# Patient Record
Sex: Female | Born: 1975 | Race: Black or African American | Hispanic: No | Marital: Single | Smoking: Former smoker
Health system: Southern US, Community
[De-identification: ages and names within clinical notes are randomized; demographics above are authoritative.]

---

## 2002-02-11 ENCOUNTER — Encounter: Payer: Self-pay | Admitting: Family Medicine

## 2002-02-11 ENCOUNTER — Ambulatory Visit (HOSPITAL_COMMUNITY): Admission: RE | Admit: 2002-02-11 | Discharge: 2002-02-11 | Payer: Self-pay | Admitting: Family Medicine

## 2004-09-28 ENCOUNTER — Emergency Department (HOSPITAL_COMMUNITY): Admission: AC | Admit: 2004-09-28 | Discharge: 2004-09-28 | Payer: Self-pay

## 2009-04-03 ENCOUNTER — Inpatient Hospital Stay (HOSPITAL_COMMUNITY): Admission: EM | Admit: 2009-04-03 | Discharge: 2009-04-04 | Payer: Self-pay | Admitting: Emergency Medicine

## 2010-05-13 IMAGING — CT CT ANGIO CHEST
2 of 6 series · 6 of 36 positions shown · IV contrast (Omnipaque 300)
Comparison: Chest radiograph from same day.

CLINICAL DATA: 33-year-old female with chest pain, shortness of
breath, right arm pain.

CT ANGIOGRAPHY CHEST WITH CONTRAST
TECHNIQUE: Multidetector CT imaging of the chest was performed
using the standard protocol during bolus administration of
intravenous contrast. Multiplanar CT image reconstructions
including MIPs were obtained to evaluate the vascular anatomy.
Contrast: 100 ml [id number].

[Series 5: pe 3.0 b40f · axial · 0.65mm/px · z∈[+772,+964]mm · 5 of 96 slices shown]
[im 16/96  lung]
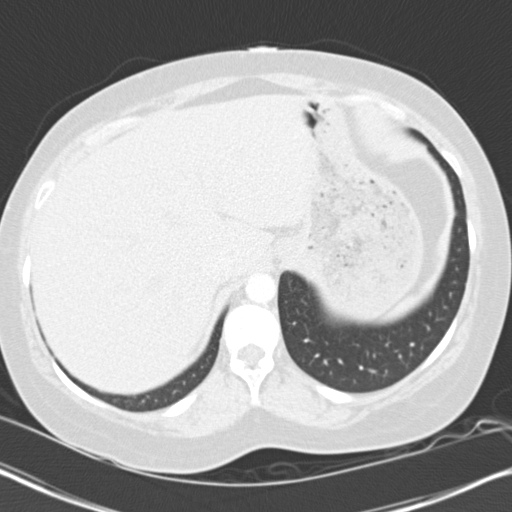
[im 32/96  mediastinal]
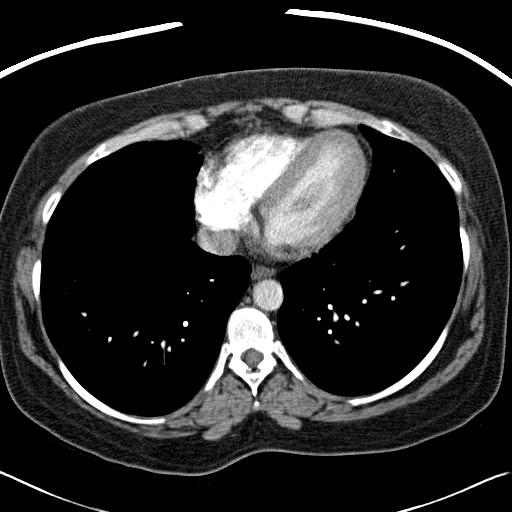
[im 48/96  lung]
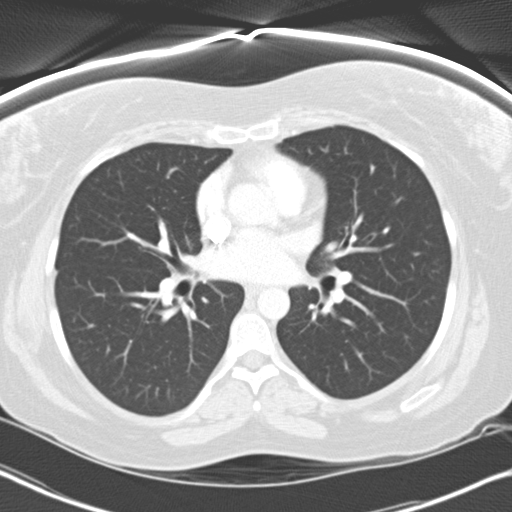
[im 64/96  mediastinal]
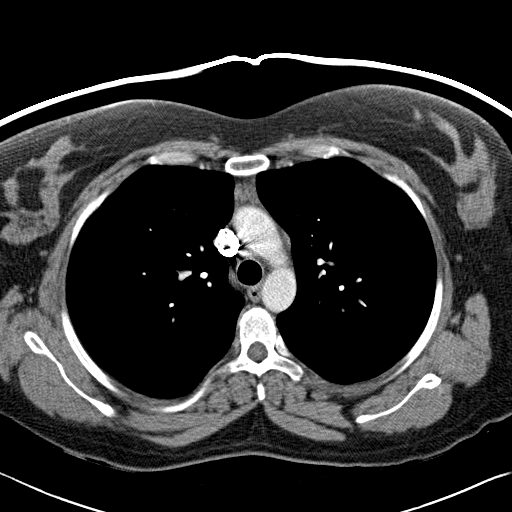
[im 80/96  lung]
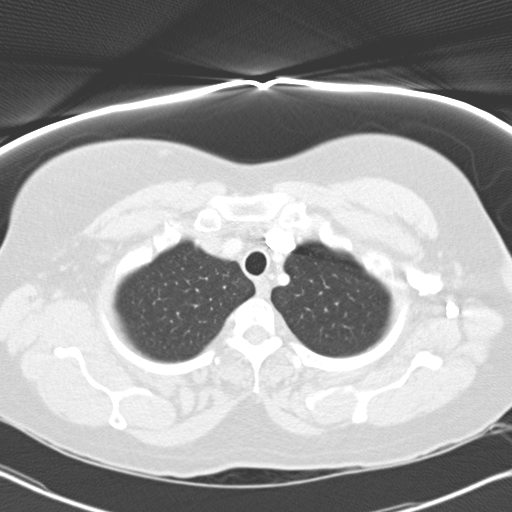

[Series 7: mpr coronal pe 3mm · coronal · 0.61mm/px · 1 of 101 slices shown]
[im 51/101  mediastinal]
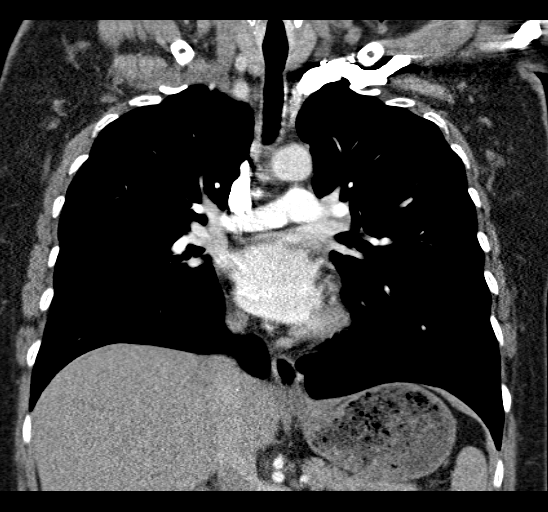

[6 of 36 positions shown; findings below may reference images not displayed]

FINDINGS: Good contrast bolus timing in the pulmonary arterial
tree.  No focal filling defect identified in the pulmonary arterial
tree to suggest the presence of acute pulmonary embolism.

Major airways are patent.  The lungs are clear.

No pericardial or pleural effusion.  No lymphadenopathy.
Visualized aorta is normal.  Visualized thoracic inlet and upper
abdominal viscera are within normal limits.

No acute osseous abnormality identified.

Review of the MIP images confirms the above findings.
IMPRESSION: 1. No evidence of acute pulmonary embolus.
2.  No acute findings in the chest.

## 2010-05-13 IMAGING — CR DG FOREARM 2V*R*
1 series · 1 of 1 positions shown · non-contrast
Comparison: Right humerus series from the same day.

CLINICAL DATA: 33-year-old female with right arm pain from shoulder
to the wrist.  No known injury.

RIGHT FOREARM - 2 VIEW

[view not recorded]
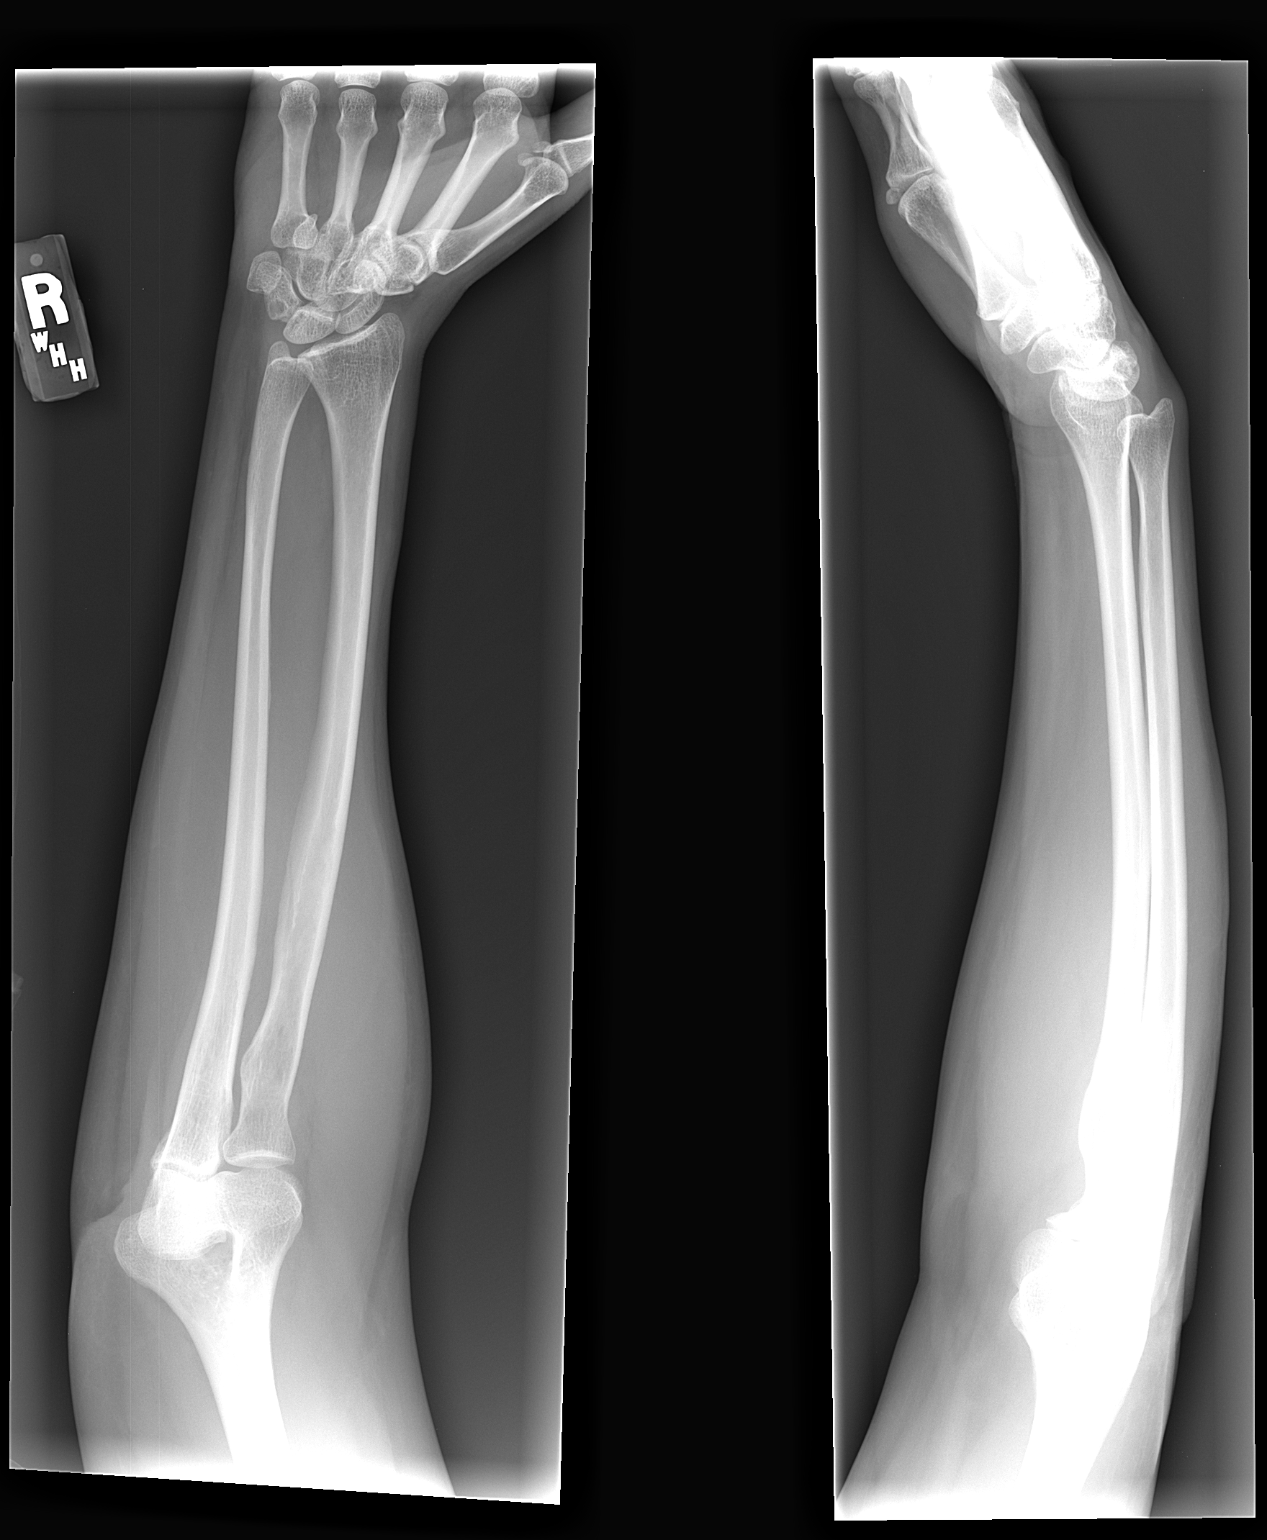

[1 of 1 positions shown; findings below may reference images not displayed]

FINDINGS: Bone mineralization is within normal limits.  Ulna minus
variance.  No fracture or dislocation identified involving the
right radius or ulna.  Grossly normal alignment at the right elbow
and right wrist.  Grossly normal right carpal bone alignment.
IMPRESSION: No acute fracture or dislocation identified about the right radius
or ulna.

## 2010-12-07 ENCOUNTER — Emergency Department (HOSPITAL_COMMUNITY): Admit: 2010-12-07 | Discharge: 2010-12-07 | Disposition: A | Discharge status: Home / Self Care | Payer: Self-pay

## 2010-12-07 ENCOUNTER — Emergency Department (HOSPITAL_COMMUNITY)
Admission: EM | Admit: 2010-12-07 | Discharge: 2010-12-07 | Disposition: A | Discharge status: Home / Self Care | Payer: Self-pay | Attending: Emergency Medicine | Admitting: Emergency Medicine

## 2010-12-07 DIAGNOSIS — IMO0002 Reserved for concepts with insufficient information to code with codable children: Secondary | ICD-10-CM | POA: Insufficient documentation

## 2010-12-07 DIAGNOSIS — T59893A Toxic effect of other specified gases, fumes and vapors, assault, initial encounter: Secondary | ICD-10-CM | POA: Insufficient documentation

## 2010-12-07 DIAGNOSIS — T593X4A Toxic effect of lacrimogenic gas, undetermined, initial encounter: Secondary | ICD-10-CM | POA: Insufficient documentation

## 2010-12-07 DIAGNOSIS — H10219 Acute toxic conjunctivitis, unspecified eye: Secondary | ICD-10-CM | POA: Insufficient documentation

## 2010-12-07 DIAGNOSIS — T5993XA Toxic effect of unspecified gases, fumes and vapors, assault, initial encounter: Secondary | ICD-10-CM | POA: Insufficient documentation

## 2011-02-11 LAB — CBC
HCT: 42.3 % (ref 36.0–46.0)
Hemoglobin: 15 g/dL (ref 12.0–15.0)
MCHC: 35.4 g/dL (ref 30.0–36.0)
MCV: 95.2 fL (ref 78.0–100.0)
Platelets: 202 10*3/uL (ref 150–400)
RBC: 4.45 MIL/uL (ref 3.87–5.11)
RDW: 12.6 % (ref 11.5–15.5)
WBC: 11.1 10*3/uL — ABNORMAL HIGH (ref 4.0–10.5)

## 2011-02-11 LAB — COMPREHENSIVE METABOLIC PANEL
ALT: 91 U/L — ABNORMAL HIGH (ref 0–35)
AST: 148 U/L — ABNORMAL HIGH (ref 0–37)
Albumin: 3.6 g/dL (ref 3.5–5.2)
Alkaline Phosphatase: 74 U/L (ref 39–117)
BUN: 4 mg/dL — ABNORMAL LOW (ref 6–23)
CO2: 22 mEq/L (ref 19–32)
Calcium: 8.6 mg/dL (ref 8.4–10.5)
Chloride: 105 mEq/L (ref 96–112)
Creatinine, Ser: 0.62 mg/dL (ref 0.4–1.2)
GFR calc Af Amer: >60 mL/min (ref >60)
GFR calc non Af Amer: >60 mL/min (ref >60)
Glucose, Bld: 90 mg/dL (ref 70–99)
Potassium: 3.8 mEq/L (ref 3.5–5.1)
Sodium: 134 mEq/L — ABNORMAL LOW (ref 135–145)
Total Bilirubin: 0.7 mg/dL (ref 0.3–1.2)
Total Protein: 6.3 g/dL (ref 6.0–8.3)

## 2011-02-11 LAB — DIFFERENTIAL
Basophils Absolute: 0 10*3/uL (ref 0.0–0.1)
Basophils Relative: 0 % (ref 0–1)
Eosinophils Absolute: 0.3 10*3/uL (ref 0.0–0.7)
Eosinophils Relative: 3 % (ref 0–5)
Lymphocytes Relative: 33 % (ref 12–46)
Lymphs Abs: 3.6 10*3/uL (ref 0.7–4.0)
Monocytes Absolute: 0.8 10*3/uL (ref 0.1–1.0)
Monocytes Relative: 7 % (ref 3–12)
Neutro Abs: 6.4 10*3/uL (ref 1.7–7.7)
Neutrophils Relative %: 58 % (ref 43–77)

## 2011-02-11 LAB — PHOSPHORUS: Phosphorus: 2.7 mg/dL (ref 2.3–4.6)

## 2011-02-11 LAB — CK: Total CK: >4100 U/L — ABNORMAL HIGH (ref 7–177)

## 2011-02-12 LAB — URINALYSIS, ROUTINE W REFLEX MICROSCOPIC
Bilirubin Urine: NEGATIVE
Glucose, UA: NEGATIVE mg/dL
Ketones, ur: NEGATIVE mg/dL
Leukocytes, UA: NEGATIVE
Nitrite: NEGATIVE
Protein, ur: NEGATIVE mg/dL
Specific Gravity, Urine: <1.005 — ABNORMAL LOW (ref 1.005–1.030)
Urobilinogen, UA: 0.2 mg/dL (ref 0.0–1.0)
pH: 5.5 (ref 5.0–8.0)

## 2011-02-12 LAB — DIFFERENTIAL
Basophils Absolute: 0.1 10*3/uL (ref 0.0–0.1)
Basophils Relative: 0 % (ref 0–1)
Eosinophils Absolute: 0.2 10*3/uL (ref 0.0–0.7)
Eosinophils Relative: 1 % (ref 0–5)
Lymphocytes Relative: 17 % (ref 12–46)
Lymphs Abs: 2.7 10*3/uL (ref 0.7–4.0)
Monocytes Absolute: 0.6 10*3/uL (ref 0.1–1.0)
Monocytes Relative: 4 % (ref 3–12)
Neutro Abs: 12.6 10*3/uL — ABNORMAL HIGH (ref 1.7–7.7)
Neutrophils Relative %: 78 % — ABNORMAL HIGH (ref 43–77)

## 2011-02-12 LAB — CBC
HCT: 42.3 % (ref 36.0–46.0)
Hemoglobin: 15.1 g/dL — ABNORMAL HIGH (ref 12.0–15.0)
MCHC: 35.7 g/dL (ref 30.0–36.0)
MCV: 95 fL (ref 78.0–100.0)
Platelets: 199 10*3/uL (ref 150–400)
RBC: 4.46 MIL/uL (ref 3.87–5.11)
RDW: 13.1 % (ref 11.5–15.5)
WBC: 16.1 10*3/uL — ABNORMAL HIGH (ref 4.0–10.5)

## 2011-02-12 LAB — RAPID URINE DRUG SCREEN, HOSP PERFORMED
Barbiturates: NOT DETECTED
Cocaine: POSITIVE — AB
Opiates: NOT DETECTED

## 2011-02-12 LAB — BASIC METABOLIC PANEL
BUN: 17 mg/dL (ref 6–23)
CO2: 24 mEq/L (ref 19–32)
Calcium: 8.9 mg/dL (ref 8.4–10.5)
Chloride: 104 mEq/L (ref 96–112)
Creatinine, Ser: 0.96 mg/dL (ref 0.4–1.2)
GFR calc Af Amer: >60 mL/min (ref >60)
GFR calc non Af Amer: >60 mL/min (ref >60)
Glucose, Bld: 163 mg/dL — ABNORMAL HIGH (ref 70–99)
Potassium: 3.8 mEq/L (ref 3.5–5.1)
Sodium: 134 mEq/L — ABNORMAL LOW (ref 135–145)

## 2011-02-12 LAB — POCT CARDIAC MARKERS
CKMB, poc: 50.5 ng/mL (ref 1.0–8.0)
CKMB, poc: 67.2 ng/mL (ref 1.0–8.0)
Myoglobin, poc: >500 ng/mL (ref 12–200)
Myoglobin, poc: >500 ng/mL (ref 12–200)
Troponin i, poc: <0.05 ng/mL (ref 0.00–0.09)
Troponin i, poc: <0.05 ng/mL (ref 0.00–0.09)

## 2011-02-12 LAB — D-DIMER, QUANTITATIVE: D-Dimer, Quant: 2.52 ug/mL-FEU — ABNORMAL HIGH (ref 0.00–0.48)

## 2011-02-12 LAB — URINE MICROSCOPIC-ADD ON

## 2011-02-12 LAB — HEPATIC FUNCTION PANEL
ALT: 89 U/L — ABNORMAL HIGH (ref 0–35)
Alkaline Phosphatase: 71 U/L (ref 39–117)
Bilirubin, Direct: 0.1 mg/dL (ref 0.0–0.3)
Indirect Bilirubin: 0.3 mg/dL (ref 0.3–0.9)

## 2011-02-12 LAB — CK TOTAL AND CKMB (NOT AT ARMC)
CK, MB: 93.3 ng/mL — ABNORMAL HIGH (ref 0.3–4.0)
Relative Index: 0.3 (ref 0.0–2.5)
Total CK: 28899 U/L — ABNORMAL HIGH (ref 7–177)

## 2011-02-12 LAB — PROTIME-INR: INR: 0.9 (ref 0.00–1.49)

## 2011-02-12 LAB — PREGNANCY, URINE: Preg Test, Ur: NEGATIVE

## 2011-02-12 LAB — CARDIAC PANEL(CRET KIN+CKTOT+MB+TROPI)
Relative Index: 1.1 (ref 0.0–2.5)
Total CK: 23309 U/L — ABNORMAL HIGH (ref 7–177)
Troponin I: <0.01 ng/mL (ref 0.00–0.06)

## 2011-02-12 LAB — PHOSPHORUS: Phosphorus: 3 mg/dL (ref 2.3–4.6)

## 2011-02-12 LAB — TROPONIN I: Troponin I: 0.02 ng/mL (ref 0.00–0.06)

## 2011-03-19 NOTE — H&P (Signed)
NAMEELIZAH, Angela Wise            ACCOUNT NO.:  [id number]   MEDICAL RECORD NO.:  [id number]          PATIENT TYPE:  EMS   LOCATION:  ED                            FACILITY:  [facility]   PHYSICIAN:  Osvaldo Shipper, MD     DATE OF BIRTH:  10-08-1976   DATE OF ADMISSION:  04/02/2009  DATE OF DISCHARGE:  LH                              HISTORY & PHYSICAL   PRIVATE MEDICAL DOCTOR:  None.   ADMITTING DIAGNOSES:  1. Rhabdomyolysis, unclear etiology.  2. Right arm pain, likely related to #1.  3. Polysubstance abuse.   CHIEF COMPLAINT:  Right arm pain and right chest pain.   HISTORY OF PRESENT ILLNESS:  The patient is a 35 year old Caucasian  female who was in her usual state of health until about 9 p.m. yesterday  night when she started feeling pain in her right chest as well as her  right upper arm.  She denies any injuries to that region. Denies any  shortness of breath.  The chest pain has resolved but the right arm pain  persists.  She is unable to lift her arm up by herself and she requires  some assistance with her left arm.  The pain was 10/10 in intensity when  she came in and currently is about 5/10 in intensity.  It was an achy,  throbbing type of pain with no radiation, no precipitating factor  identified.  The pain is exacerbated by movement and was not relieved  with Advil, mostly located in the right upper arm.  She also has some  tingling and numbness in the right arm.  She has had similar complaints,  about 3 times, in the past, all of them about one month ago.   MEDICATIONS:  None except for the Advil that she took to try and relieve  the pain.   ALLERGIES:  No known drug allergies.   PAST MEDICAL HISTORY:  Just had a cesarean section many years ago,  otherwise denies any medical problems.   SOCIAL HISTORY:  Lives with her mother in [place].  Currently  unemployed.  Smokes 1/2 pack of cigarettes on a daily basis.  Occasional  alcohol use, not on a daily basis.  She  admits to using marijuana.  She  did do cocaine one month ago.  She denies any recent use of cocaine.   FAMILY HISTORY:  She is adopted so she does not know her family medical  history.   REVIEW OF SYSTEMS:  GENERAL:  Positive for weakness.  HEENT:  Unremarkable.  CARDIOVASCULAR:  Unremarkable.  RESPIRATORY:  Unremarkable.  GI: Unremarkable.  GU:  Unremarkable.  MUSCULOSKELETAL:  As in HPI.  NEUROLOGICAL: Unremarkable.  PSYCHIATRIC:  Unremarkable.  DERMATOLOGIC:  Unremarkable. Other systems unremarkable.   PHYSICAL EXAMINATION:  VITAL SIGNS:  Temperature 97.4, blood pressure  134/64, heart rate 81, respiratory rate 16, oxygen saturation 100% in  room air.  GENERAL:  This is a slightly overweight white female in no distress.  HEENT:  There is no pallor, no icterus.  Oral mucosal membranes are  moist, no oral lesions are noted.  NECK: Supple,  no thyromegaly is appreciated.  No cervical  lymphadenopathy is noted.  LUNGS:  Clear to auscultation bilaterally. No wheezing, rales or  rhonchi.  Palpation of the right chest wall does not elicit any  tenderness.  CARDIOVASCULAR:  S1 and S2 normal and regular.  No murmurs appreciated.  No S3, S4, rubs or bruits.  ABDOMEN:  Soft, nontender, nondistended.  Bowel sounds present.  No mass  or organomegaly is appreciated.  GU:  Deferred.  NEUROLOGIC:  She is alert and oriented x3, no focal neurological  deficits are present apart from the right upper extremity.  The  strength, especially in the shoulder flexors and abductors, are found to  be about 3-4/5.  Peripheral pulses are palpable.  No pedal edema is present.  Right upper  arm does not show any swelling, no muscle tenderness is noted on  palpation.  No skin rash is noted.  When I try to abduct her right  shoulder and flex it, she experiences pain.   LABORATORY DATA:  Her white count is 16,000 with 78% neutrophils,  hemoglobin 15, MCV 95, platelet count 199,000, D-dimer 2.52, sodium 134,   glucose 163, renal functions normal.  Total CK 28,800.  Normal  troponins.  UA shows moderate blood with 0-2 rbc's.  She had a chest x-  ray which did not show any acute cardiopulmonary process.  Right humerus  which did not show any fracture.  She had a right forearm x-ray which  did not show any fractures.  CT of the chest was done because of  elevated D-dimer and it showed no PE and no other acute findings.   EKG was done which showed sinus rhythm, rate of 74 with normal axis,  intervals appear to be in normal range. No Q waves are present.  We do  notice T inversion in V1 and V2.   ASSESSMENT:  This is a 35 year old Caucasian female who presents with  right arm pain and is found  to have elevated total CK.  She appears to  have rhabdomyolysis which is likely causing the right arm pain.  Etiology is unclear.  She admits to cocaine use a month ago which could  have caused those episodes a month ago but she denies any recent cocaine  use.  I am not sure if the effects can last this long, especially after  relief of symptoms in between.  We will check a urine drug screen and if  that is not thought to be a possibility, we need to rule out other  etiologies, electrolyte abnormalities in the form of hypophosphatemia  will be ruled out.  Polymyositis needs to be considered.  Antibodies  such as anti-Ro, anti-LA, anti-SM, anti-RNP may have to be ordered if  urine drug screen is negative.  She is not on any medication that could  be causing this.  If these symptoms persist as an outpatient, she may be  a candidate for EMG.   PLAN:  1. Rhabdomyolysis.  She will be treated with aggressive IV hydration.      She is currently getting mannitol diuresis, which was ordered by      the ED physician.  Will monitor her total CK levels, monitor her      renal functions.  Phosphorus levels will be followed up.  If CKs do      not improve and start going up, we may have to consider alkaline       diuresis and put her on a  bicarbonate drip.  Urine output will be      closely monitored.  LFTs will be checked as well.  2. For polysubstance abuse, she was counseled.  Urine drug screen will      be checked.  3. Elevated blood sugar.  This is a nonfasting level.  Will check Hb      A1c.  4. Leukocytosis with no fever. No antibiotic use at this time.  Follow      this closely.  5. Deep vein thrombosis prophylaxis will be utilized.  Doppler study      of the right arm will be considered to rule out DVT although I do      not see any obvious swelling.      Osvaldo Shipper, MD  Electronically Signed     GK/MEDQ  D:  04/03/2009  T:  04/03/2009  Job:  [id number]

## 2011-03-22 ENCOUNTER — Emergency Department (HOSPITAL_COMMUNITY)
Admission: EM | Admit: 2011-03-22 | Discharge: 2011-03-22 | Disposition: A | Discharge status: Home / Self Care | Payer: Self-pay | Attending: Emergency Medicine | Admitting: Emergency Medicine

## 2011-03-22 DIAGNOSIS — H7290 Unspecified perforation of tympanic membrane, unspecified ear: Secondary | ICD-10-CM | POA: Insufficient documentation

## 2011-03-22 DIAGNOSIS — H9210 Otorrhea, unspecified ear: Secondary | ICD-10-CM | POA: Insufficient documentation

## 2011-03-22 DIAGNOSIS — H9209 Otalgia, unspecified ear: Secondary | ICD-10-CM | POA: Insufficient documentation

## 2011-09-21 ENCOUNTER — Emergency Department (HOSPITAL_COMMUNITY)
Admission: EM | Admit: 2011-09-21 | Discharge: 2011-09-21 | Disposition: A | Discharge status: Home / Self Care | Payer: Self-pay | Attending: Emergency Medicine | Admitting: Emergency Medicine

## 2011-09-21 DIAGNOSIS — F17200 Nicotine dependence, unspecified, uncomplicated: Secondary | ICD-10-CM | POA: Insufficient documentation

## 2011-09-21 DIAGNOSIS — H6691 Otitis media, unspecified, right ear: Secondary | ICD-10-CM

## 2011-09-21 DIAGNOSIS — H900 Conductive hearing loss, bilateral: Secondary | ICD-10-CM | POA: Insufficient documentation

## 2011-09-21 DIAGNOSIS — H6690 Otitis media, unspecified, unspecified ear: Secondary | ICD-10-CM | POA: Insufficient documentation

## 2011-09-21 MED ORDER — AMOXICILLIN 250 MG PO CAPS
500.0000 mg | ORAL_CAPSULE | Freq: Once | ORAL | Status: AC
  Administered 2011-09-21: 500 mg via ORAL
  Filled 2011-09-21: qty 2

## 2011-09-21 MED ORDER — AMOXICILLIN 500 MG PO CAPS: 500.0000 mg | ORAL_CAPSULE | Freq: Three times a day (TID) | ORAL | Status: AC

## 2011-09-21 NOTE — ED Notes (Signed)
Pt a/ox4. resp even and unlabored. NAD at this time. D/C instructions and Rx x1 reviewed with pt. Pt verbalized understanding. Pt ambulated to POV with steady gate.

## 2011-09-21 NOTE — ED Provider Notes (Signed)
History     CSN: [id number] Arrival date & time: 09/21/2011  4:06 PM   First MD Initiated Contact with Patient 09/21/11 1612      Chief Complaint  Patient presents with  . URI    (Consider location/radiation/quality/duration/timing/severity/associated sxs/prior treatment) Patient is a 35 y.o. female presenting with URI. The history is provided by the patient. No language interpreter was used.  URI The primary symptoms include ear pain. Primary symptoms do not include fever or headaches. Primary symptoms comment: nasal congestion, decreased hearing  The current episode started 3 to 5 days ago. This is a new problem. The problem has not changed since onset. Symptoms associated with the illness include plugged ear sensation and congestion. The illness is not associated with rhinorrhea.    History reviewed. No pertinent past medical history.  History reviewed. No pertinent past surgical history.  No family history on file.  History  Substance Use Topics  . Smoking status: Current Everyday Smoker  . Smokeless tobacco: Not on file  . Alcohol Use: No    OB History    Grav Para Term Preterm Abortions TAB SAB Ect Mult Living                  Review of Systems  Constitutional: Negative for fever.  HENT: Positive for hearing loss, ear pain and congestion. Negative for rhinorrhea.   Neurological: Negative for headaches.  All other systems reviewed and are negative.    Allergies  Review of patient's allergies indicates no known allergies.  Home Medications  No current outpatient prescriptions on file.  BP 133/75  Pulse 97  Temp(Src) 98.5 F (36.9 C) (Oral)  Resp 18  Ht 5\' 1"  (1.549 m)  Wt 150 lb (68.04 kg)  BMI 28.34 kg/m2  SpO2 98%  LMP 09/19/2011  Physical Exam  Nursing note and vitals reviewed. Constitutional: She is oriented to person, place, and time. She appears well-developed and well-nourished. No distress.  HENT:  Head: Normocephalic and atraumatic.    Right Ear: External ear and ear canal normal. No drainage. Tympanic membrane is bulging. Tympanic membrane is not perforated. A middle ear effusion is present. Decreased hearing is noted.  Left Ear: External ear and ear canal normal. Tympanic membrane is injected. Tympanic membrane is not perforated. Decreased hearing is noted.  Mouth/Throat: No oropharyngeal exudate.       R TM bulging and no landmarks visible.  L TM injected but no bulging.  Pain and hearing loss > in R ear than L ear.  Eyes: EOM are normal.  Neck: Normal range of motion.  Cardiovascular: Normal rate, regular rhythm and normal heart sounds.   Pulmonary/Chest: Effort normal and breath sounds normal.  Abdominal: Soft. She exhibits no distension. There is no tenderness.  Musculoskeletal: Normal range of motion.  Lymphadenopathy:    She has no cervical adenopathy.  Neurological: She is alert and oriented to person, place, and time.  Skin: Skin is warm and dry.  Psychiatric: She has a normal mood and affect. Judgment normal.    ED Course  Procedures (including critical care time)  Labs Reviewed - No data to display No results found.   No diagnosis found.    MDM          Worthy Rancher, PA 09/21/11 1634  Worthy Rancher, PA 09/21/11 [phone]

## 2011-09-21 NOTE — ED Notes (Signed)
Pt reports cold symptoms x 1 week.  Reports yesterday ears and nose became very stopped up.  Pt says is having difficulty hearing, smelling, and tasting.

## 2011-09-21 NOTE — ED Provider Notes (Signed)
Medical screening examination/treatment/procedure(s) were performed by non-physician practitioner and as supervising physician I was immediately available for consultation/collaboration.  Tahirah Sara, MD 09/21/11 1933 

## 2013-06-01 ENCOUNTER — Emergency Department (HOSPITAL_COMMUNITY): Payer: Self-pay

## 2013-06-01 ENCOUNTER — Encounter (HOSPITAL_COMMUNITY): Payer: Self-pay | Admitting: *Deleted

## 2013-06-01 ENCOUNTER — Emergency Department (HOSPITAL_COMMUNITY)
Admission: EM | Admit: 2013-06-01 | Discharge: 2013-06-01 | Disposition: A | Discharge status: Home / Self Care | Payer: Self-pay | Attending: Emergency Medicine | Admitting: Emergency Medicine

## 2013-06-01 DIAGNOSIS — R5383 Other fatigue: Secondary | ICD-10-CM | POA: Insufficient documentation

## 2013-06-01 DIAGNOSIS — F17200 Nicotine dependence, unspecified, uncomplicated: Secondary | ICD-10-CM | POA: Insufficient documentation

## 2013-06-01 DIAGNOSIS — R5381 Other malaise: Secondary | ICD-10-CM | POA: Insufficient documentation

## 2013-06-01 DIAGNOSIS — Z3202 Encounter for pregnancy test, result negative: Secondary | ICD-10-CM | POA: Insufficient documentation

## 2013-06-01 DIAGNOSIS — R202 Paresthesia of skin: Secondary | ICD-10-CM

## 2013-06-01 DIAGNOSIS — R209 Unspecified disturbances of skin sensation: Secondary | ICD-10-CM | POA: Insufficient documentation

## 2013-06-01 LAB — BASIC METABOLIC PANEL
Calcium: 9.4 mg/dL (ref 8.4–10.5)
GFR calc non Af Amer: >90 mL/min (ref >90)
Glucose, Bld: 93 mg/dL (ref 70–99)
Sodium: 137 mEq/L (ref 135–145)

## 2013-06-01 LAB — URINALYSIS, ROUTINE W REFLEX MICROSCOPIC
Ketones, ur: 15 mg/dL — AB
Leukocytes, UA: NEGATIVE
Nitrite: NEGATIVE
Protein, ur: NEGATIVE mg/dL
Urobilinogen, UA: 0.2 mg/dL (ref 0.0–1.0)
pH: 6 (ref 5.0–8.0)

## 2013-06-01 LAB — PREGNANCY, URINE: Preg Test, Ur: NEGATIVE

## 2013-06-01 LAB — CBC WITH DIFFERENTIAL/PLATELET
Basophils Absolute: 0.1 10*3/uL (ref 0.0–0.1)
Basophils Relative: 0 % (ref 0–1)
Eosinophils Absolute: 0.3 10*3/uL (ref 0.0–0.7)
Lymphs Abs: 5.7 10*3/uL — ABNORMAL HIGH (ref 0.7–4.0)
MCH: 32.6 pg (ref 26.0–34.0)
Neutrophils Relative %: 48 % (ref 43–77)
Platelets: 258 10*3/uL (ref 150–400)
RBC: 4.45 MIL/uL (ref 3.87–5.11)
RDW: 12.8 % (ref 11.5–15.5)

## 2013-06-01 NOTE — ED Provider Notes (Signed)
CSN: [id number]     Arrival date & time 06/01/13  1846 History     First MD Initiated Contact with Patient 06/01/13 1905     Chief Complaint  Patient presents with  . Numbness  . Weakness    HPI Pt was seen at 1915. Per pt, c/o gradual onset and persistence of constant LUE and LLE "tingling" and "numbness" for the past 3 days. Has been associated with feeling generally weak and fatigued. Denies fevers, no injury, no headache, no neck pain, no back pain, no CP/SOB, no abd pain, no N/V/D, no visual changes, no focal motor weakness, no ataxia, no dysphagia, no facial droop, no slurred speech.    History reviewed. No pertinent past medical history.  History reviewed. No pertinent past surgical history.  History  Substance Use Topics  . Smoking status: Current Every Day Smoker    Types: Cigarettes  . Smokeless tobacco: Not on file  . Alcohol Use: Yes     Comment: occasional    Review of Systems ROS: Statement: All systems negative except as marked or noted in the HPI; Constitutional: Negative for fever and chills. +generalized weakness/fatigue.; ; Eyes: Negative for eye pain, redness and discharge. ; ; ENMT: Negative for ear pain, hoarseness, nasal congestion, sinus pressure and sore throat. ; ; Cardiovascular: Negative for chest pain, palpitations, diaphoresis, dyspnea and peripheral edema. ; ; Respiratory: Negative for cough, wheezing and stridor. ; ; Gastrointestinal: Negative for nausea, vomiting, diarrhea, abdominal pain, blood in stool, hematemesis, jaundice and rectal bleeding. ; ; Genitourinary: Negative for dysuria, flank pain and hematuria. ; ; Musculoskeletal: Negative for back pain and neck pain. Negative for swelling and trauma.; ; Skin: Negative for pruritus, rash, abrasions, blisters, bruising and skin lesion.; ; Neuro: +paresthesias. Negative for headache, lightheadedness and neck stiffness. Negative for altered level of consciousness , altered mental status, extremity  weakness, involuntary movement, seizure and syncope.     Allergies  Review of patient's allergies indicates no known allergies.  Home Medications   Current Outpatient Rx  Name  Route  Sig  Dispense  Refill  . ibuprofen (ADVIL,MOTRIN) 200 MG tablet   Oral   Take 200 mg by mouth every 6 (six) hours as needed for pain.          BP 116/77  Pulse 80  Temp(Src) 98.4 F (36.9 C) (Oral)  Resp 18  Ht 5\' 1"  (1.549 m)  Wt 152 lb (68.947 kg)  BMI 28.74 kg/m2  SpO2 100%  LMP 05/24/2013 Physical Exam 1920: Physical examination:  Nursing notes reviewed; Vital signs and O2 SAT reviewed;  Constitutional: Well developed, Well nourished, Well hydrated, In no acute distress; Head:  Normocephalic, atraumatic; Eyes: EOMI, PERRL, No scleral icterus; ENMT: Mouth and pharynx normal, Mucous membranes moist; Neck: Supple, Full range of motion, No lymphadenopathy; Cardiovascular: Regular rate and rhythm, No murmur, rub, or gallop; Respiratory: Breath sounds clear & equal bilaterally, No rales, rhonchi, wheezes.  Speaking full sentences with ease, Normal respiratory effort/excursion; Chest: Nontender, Movement normal; Abdomen: Soft, Nontender, Nondistended, Normal bowel sounds; Genitourinary: No CVA tenderness; Extremities: Pulses normal, No tenderness, No edema, No calf edema or asymmetry.  Sensation intact over bilat deltoid regions, distal NMS intact with bilat hands having intact and equal sensation and strength in the distribution of the median, radial, and ulnar nerve function compared to opposite side.  Strong radial pulses.;; Neuro: AA&Ox3, Major CN grossly intact.  Strength 5/5 equal bilat UE's and LE's.  DTR 2/4 equal bilat UE's and  LE's.  No gross sensory deficits.  Normal cerebellar testing bilat UE's (finger-nose) and LE's (heel-shin).  Speech clear.  No facial droop. Climbs on and off stretcher easily by herself. Gait steady.; Skin: Color normal, Warm, Dry.   ED Course   Procedures   MDM   MDM Reviewed: previous chart, nursing note and vitals Reviewed previous: labs Interpretation: labs, x-ray and CT scan   Results for orders placed during the hospital encounter of 06/01/13  BASIC METABOLIC PANEL      Result Value Range   Sodium 137  135 - 145 mEq/L   Potassium 3.6  3.5 - 5.1 mEq/L   Chloride 102  96 - 112 mEq/L   CO2 27  19 - 32 mEq/L   Glucose, Bld 93  70 - 99 mg/dL   BUN 19  6 - 23 mg/dL   Creatinine, Ser 1.61  0.50 - 1.10 mg/dL   Calcium 9.4  8.4 - 09.6 mg/dL   GFR calc non Af Amer >90  >90 mL/min   GFR calc Af Amer >90  >90 mL/min  CBC WITH DIFFERENTIAL      Result Value Range   WBC 12.9 (*) 4.0 - 10.5 K/uL   RBC 4.45  3.87 - 5.11 MIL/uL   Hemoglobin 14.5  12.0 - 15.0 g/dL   HCT 04.5  40.9 - 81.1 %   MCV 93.9  78.0 - 100.0 fL   MCH 32.6  26.0 - 34.0 pg   MCHC 34.7  30.0 - 36.0 g/dL   RDW 91.4  78.2 - 95.6 %   Platelets 258  150 - 400 K/uL   Neutrophils Relative % 48  43 - 77 %   Neutro Abs 6.2  1.7 - 7.7 K/uL   Lymphocytes Relative 44  12 - 46 %   Lymphs Abs 5.7 (*) 0.7 - 4.0 K/uL   Monocytes Relative 5  3 - 12 %   Monocytes Absolute 0.7  0.1 - 1.0 K/uL   Eosinophils Relative 2  0 - 5 %   Eosinophils Absolute 0.3  0.0 - 0.7 K/uL   Basophils Relative 0  0 - 1 %   Basophils Absolute 0.1  0.0 - 0.1 K/uL  PREGNANCY, URINE      Result Value Range   Preg Test, Ur NEGATIVE  NEGATIVE  URINALYSIS, ROUTINE W REFLEX MICROSCOPIC      Result Value Range   Color, Urine YELLOW  YELLOW   APPearance CLOUDY (*) CLEAR   Specific Gravity, Urine 1.025  1.005 - 1.030   pH 6.0  5.0 - 8.0   Glucose, UA NEGATIVE  NEGATIVE mg/dL   Hgb urine dipstick TRACE (*) NEGATIVE   Bilirubin Urine NEGATIVE  NEGATIVE   Ketones, ur 15 (*) NEGATIVE mg/dL   Protein, ur NEGATIVE  NEGATIVE mg/dL   Urobilinogen, UA 0.2  0.0 - 1.0 mg/dL   Nitrite NEGATIVE  NEGATIVE   Leukocytes, UA NEGATIVE  NEGATIVE  URINE MICROSCOPIC-ADD ON      Result Value Range   Squamous Epithelial / LPF FEW  (*) RARE   Dg Chest 2 View 06/01/2013   *RADIOLOGY REPORT*  Clinical Data:  Weakness and tingling in the left arm.  CHEST - 2 VIEW  Comparison: 04/03/2009  Findings: The heart size and mediastinal contours are within normal limits.  Both lungs are clear.  The visualized skeletal structures are unremarkable.  IMPRESSION: No active disease.   Original Report Authenticated By: Irish Lack, M.D.   Ct Head  Wo Contrast 06/01/2013   *RADIOLOGY REPORT*  Clinical Data: Some weakness  CT HEAD WITHOUT CONTRAST  Technique:  Contiguous axial images were obtained from the base of the skull through the vertex without contrast.  Comparison: 09/28/2004  Findings: The bony calvarium is intact.  Ventricles are within normal limits.  No significant atrophic changes or chronic white matter ischemic changes are seen.  No findings to suggest acute hemorrhage, acute infarction or space-occupying mass lesion are noted.  IMPRESSION: No acute abnormality noted.   Original Report Authenticated By: Alcide Clever, M.D.    2145:  Pt has ambulated with steady gait, easy resps. VS remain stable. No neuro deficits on initial exam and neuro exam remains unchanged. CT-H negative for acute CVA after 3 days of constant symptoms. Pt wants to go home now. Dx and testing d/w pt and family.  Questions answered.  Verb understanding, agreeable to d/c home with outpt f/u.   Laray Anger, DO 06/03/13 1555

## 2013-06-01 NOTE — ED Notes (Signed)
No change in pt status. Still has vague tingling in left arm and leg.

## 2013-06-01 NOTE — ED Notes (Signed)
Has ambulated to and from bathroom w/o difficulty

## 2013-06-01 NOTE — ED Notes (Signed)
Reports left arm and left leg numbness/tingling x 3 days.  Reports generalized weakness starting today.  Normal, steady gait.  Speech clear.

## 2018-04-28 ENCOUNTER — Other Ambulatory Visit: Payer: Self-pay

## 2018-04-28 ENCOUNTER — Emergency Department (HOSPITAL_COMMUNITY)
Admission: EM | Admit: 2018-04-28 | Discharge: 2018-04-28 | Disposition: A | Discharge status: Home / Self Care | Payer: Self-pay | Attending: Emergency Medicine | Admitting: Emergency Medicine

## 2018-04-28 ENCOUNTER — Encounter (HOSPITAL_COMMUNITY): Payer: Self-pay | Admitting: Emergency Medicine

## 2018-04-28 ENCOUNTER — Emergency Department (HOSPITAL_COMMUNITY): Payer: Self-pay

## 2018-04-28 DIAGNOSIS — M79602 Pain in left arm: Secondary | ICD-10-CM

## 2018-04-28 DIAGNOSIS — Z79899 Other long term (current) drug therapy: Secondary | ICD-10-CM | POA: Insufficient documentation

## 2018-04-28 DIAGNOSIS — Z87891 Personal history of nicotine dependence: Secondary | ICD-10-CM | POA: Insufficient documentation

## 2018-04-28 DIAGNOSIS — M79622 Pain in left upper arm: Secondary | ICD-10-CM | POA: Insufficient documentation

## 2018-04-28 LAB — BASIC METABOLIC PANEL
Anion gap: 11 (ref 5–15)
BUN: 20 mg/dL (ref 6–20)
CALCIUM: 8.7 mg/dL — AB (ref 8.9–10.3)
CO2: 22 mmol/L (ref 22–32)
CREATININE: 0.8 mg/dL (ref 0.44–1.00)
Chloride: 102 mmol/L (ref 98–111)
GFR calc non Af Amer: >60 mL/min (ref >60)
GLUCOSE: 188 mg/dL — AB (ref 70–99)
Potassium: 3.4 mmol/L — ABNORMAL LOW (ref 3.5–5.1)
Sodium: 135 mmol/L (ref 135–145)

## 2018-04-28 LAB — CBC
HCT: 41.6 % (ref 36.0–46.0)
Hemoglobin: 14 g/dL (ref 12.0–15.0)
MCH: 31.5 pg (ref 26.0–34.0)
MCHC: 33.7 g/dL (ref 30.0–36.0)
MCV: 93.7 fL (ref 78.0–100.0)
PLATELETS: 297 10*3/uL (ref 150–400)
RBC: 4.44 MIL/uL (ref 3.87–5.11)
RDW: 13 % (ref 11.5–15.5)
WBC: 14.9 10*3/uL — ABNORMAL HIGH (ref 4.0–10.5)

## 2018-04-28 LAB — TROPONIN I: Troponin I: <0.03 ng/mL (ref <0.03)

## 2018-04-28 LAB — D-DIMER, QUANTITATIVE: D-Dimer, Quant: 0.51 ug/mL-FEU — ABNORMAL HIGH (ref 0.00–0.50)

## 2018-04-28 MED ORDER — OXYCODONE-ACETAMINOPHEN 5-325 MG PO TABS
1.0000 | ORAL_TABLET | Freq: Once | ORAL | Status: AC
  Administered 2018-04-28: 1 via ORAL
  Filled 2018-04-28: qty 1

## 2018-04-28 MED ORDER — METHYLPREDNISOLONE 4 MG PO TBPK: ORAL_TABLET | ORAL | 0 refills | Status: DC

## 2018-04-28 MED ORDER — DIAZEPAM 5 MG PO TABS
2.5000 mg | ORAL_TABLET | Freq: Once | ORAL | Status: AC
  Administered 2018-04-28: 2.5 mg via ORAL
  Filled 2018-04-28: qty 1

## 2018-04-28 MED ORDER — METHOCARBAMOL 500 MG PO TABS: 500.0000 mg | ORAL_TABLET | Freq: Three times a day (TID) | ORAL | 0 refills | Status: DC | PRN

## 2018-04-28 NOTE — ED Provider Notes (Signed)
[facility] EMERGENCY DEPARTMENT Provider Note   CSN: [id number] Arrival date & time: 04/28/18  0007     History   Chief Complaint Chief Complaint  Patient presents with  . Arm Pain    HPI Angela Wise is a 42 y.o. female.  Patient complains of left upper arm and shoulder pain that onset around midnight while she was watching TV.  Denies any injury.  Pain is worse with palpation and movement.  She is never had this kind of pain in the past.  No chest pain or shortness of breath.  Denies any low back pain or neck pain.  Denies any numbness or tingling or weakness in the left arm.  She took some ibuprofen at home with partial relief.  She denies any other medical problems.  She is never had this kind of pain previously.  The history is provided by the patient.  Arm Pain  Pertinent negatives include no chest pain, no abdominal pain, no headaches and no shortness of breath.    History reviewed. No pertinent past medical history.  There are no active problems to display for this patient.   Past Surgical History:  Procedure Laterality Date  . CESAREAN SECTION       OB History   None      Home Medications    Prior to Admission medications   Medication Sig Start Date End Date Taking? Authorizing Provider  ibuprofen (ADVIL,MOTRIN) 200 MG tablet Take 200 mg by mouth every 6 (six) hours as needed for pain.    [provider]    Family History History reviewed. No pertinent family history.  Social History Social History   Tobacco Use  . Smoking status: Former Smoker    Types: Cigarettes  . Smokeless tobacco: Never Used  Substance Use Topics  . Alcohol use: Yes    Comment: occasional  . Drug use: Yes    Types: Marijuana, Cocaine    Comment: last used x 1 wk ago     Allergies   Patient has no known allergies.   Review of Systems Review of Systems  Constitutional: Negative for activity change, appetite change and fever.  HENT: Negative for  congestion, rhinorrhea and sinus pain.   Eyes: Negative for visual disturbance.  Respiratory: Negative for cough, chest tightness and shortness of breath.   Cardiovascular: Negative for chest pain.  Gastrointestinal: Negative for abdominal pain, nausea and vomiting.  Genitourinary: Negative for dysuria and hematuria.  Musculoskeletal: Positive for arthralgias and myalgias.  Skin: Negative for rash.  Neurological: Negative for dizziness, weakness and headaches.    all other systems are negative except as noted in the HPI and PMH.    Physical Exam Updated Vital Signs BP 117/79   Pulse 74   Temp (!) 97.5 F (36.4 C)   Resp 17   Ht 5\' 2"  (1.575 m)   Wt 85.7 kg (189 lb)   LMP 04/21/2018   SpO2 99%   BMI 34.57 kg/m   Physical Exam  Constitutional: She is oriented to person, place, and time. She appears well-developed and well-nourished. No distress.  HENT:  Head: Normocephalic and atraumatic.  Mouth/Throat: Oropharynx is clear and moist. No oropharyngeal exudate.  Eyes: Pupils are equal, round, and reactive to light. Conjunctivae and EOM are normal.  Neck: Normal range of motion. Neck supple.  No meningismus.  Cardiovascular: Normal rate, regular rhythm, normal heart sounds and intact distal pulses.  No murmur heard. Pulmonary/Chest: Effort normal and breath sounds normal.  No respiratory distress.  Abdominal: Soft. There is no tenderness. There is no rebound and no guarding.  Musculoskeletal: Normal range of motion. She exhibits tenderness. She exhibits no edema.  TTP L lateral shoulder, paraspinal cervical muscles. TTP across trapezius and rhomboid.   Neurological: She is alert and oriented to person, place, and time. No cranial nerve deficit. She exhibits normal muscle tone. Coordination normal.  No ataxia on finger to nose bilaterally. No pronator drift. 5/5 strength throughout. CN 2-12 intact.Equal grip strength. Sensation intact.   Skin: Skin is warm.  Psychiatric: She has  a normal mood and affect. Her behavior is normal.  Nursing note and vitals reviewed.    ED Treatments / Results  Labs (all labs ordered are listed, but only abnormal results are displayed) Labs Reviewed  BASIC METABOLIC PANEL - Abnormal; Notable for the following components:      Result Value   Potassium 3.4 (*)    Glucose, Bld 188 (*)    Calcium 8.7 (*)    All other components within normal limits  CBC - Abnormal; Notable for the following components:   WBC 14.9 (*)    All other components within normal limits  D-DIMER, QUANTITATIVE (NOT AT [facility]) - Abnormal; Notable for the following components:   D-Dimer, Quant 0.51 (*)    All other components within normal limits  TROPONIN I  TROPONIN I    EKG EKG Interpretation  Date/Time:  Tuesday April 28 2018 00:24:02 EDT Ventricular Rate:  57 PR Interval:  154 QRS Duration: 74 QT Interval:  420 QTC Calculation: 408 R Axis:   30 Text Interpretation:  Sinus bradycardia Otherwise normal ECG No significant change was found Confirmed by Glynn Octave [phone]) on 04/28/2018 3:58:20 AM   Radiology Dg Chest 2 View  Result Date: 04/28/2018 CLINICAL DATA:  Right arm pain and dyspnea.  Former smoker. EXAM: CHEST - 2 VIEW COMPARISON:  06/01/2013 FINDINGS: Chronic mild interstitial prominence compatible with chronic bronchitic change. Bibasilar atelectasis is noted. Subtle airspace opacities at the left lung base cannot exclude superimposed pneumonia. No effusion or pneumothorax. Heart is top-normal in size. No aortic aneurysm. No acute osseous abnormality. IMPRESSION: Mild bronchitic change of the lungs with bibasilar atelectasis. Slightly more confluent airspace opacities at the left lung base raise concern for superimposed pneumonia as best seen on the frontal projection. Electronically Signed   By: Tollie Eth M.D.   On: 04/28/2018 01:28   Dg Cervical Spine Complete  Result Date: 04/28/2018 CLINICAL DATA:  42 year old female with right  shoulder and arm pain. EXAM: CERVICAL SPINE - COMPLETE 4+ VIEW COMPARISON:  None. FINDINGS: No acute fracture or subluxation of the cervical spine. Evaluation of the lower cervical spine on the lateral image is limited due to superimposition of the shoulder. There is osteopenia with multilevel degenerative changes. There are multilevel narrowing of the neural foramina on the right. The visualized posterior elements and the odontoid appear intact. There is anatomic alignment of the lateral masses of C1 and C2. The soft tissues appear unremarkable. IMPRESSION: 1. No acute/traumatic cervical spine pathology. 2. Degenerative changes with multilevel right neural foramina narrowing. MRI may provide better evaluation. Electronically Signed   By: Elgie Collard M.D.   On: 04/28/2018 03:27    Procedures Procedures (including critical care time)  Medications Ordered in ED Medications  diazepam (VALIUM) tablet 2.5 mg (2.5 mg Oral Given 04/28/18 0156)  oxyCODONE-acetaminophen (PERCOCET/ROXICET) 5-325 MG per tablet 1 tablet (1 tablet Oral Given 04/28/18 0156)  Initial Impression / Assessment and Plan / ED Course  I have reviewed the triage vital signs and the nursing notes.  Pertinent labs & imaging results that were available during my care of the patient were reviewed by me and considered in my medical decision making (see chart for details).    Left arm pain for the past several hours without injury.  EKG is nonischemic.  Patient is neurovascularly intact.  Troponin is negative.  Low suspicion for ACS.  Pain improved with treatment in the ED. Troponin negative x2. D-dimer minimally elevated. Doubt PE.  Suspect cervical radiculopathy versus MSK pain. Will treat with steroids and muscle relaxers. Doppler US ordered for AM but doubt DVT.  Followup with PCP. Return precautions discussed.   Final Clinical Impressions(s) / ED Diagnoses   Final diagnoses:  Left arm pain    ED Discharge Orders     None       Othella Slappey, Jeannett SeniorStephen, MD 04/28/18 [phone]

## 2018-04-28 NOTE — ED Triage Notes (Signed)
Pt c/o left arm pain a little over an hour and denies any injury to the area.

## 2018-04-28 NOTE — ED Notes (Signed)
Pt reports left upper arm pain that started around midnight while laying down watching tv. Pt denies injuries

## 2018-04-28 NOTE — Discharge Instructions (Addendum)
There is no evidence of heart attack.  Your pain is likely coming from a pinched nerve in your neck or possibly muscular in origin.  You should return later today to have an ultrasound of your arm to rule out blood clots.  You can call the number provided to set this up.  Return to the ED if you develop new or worsening symptoms.

## 2018-04-30 ENCOUNTER — Ambulatory Visit (HOSPITAL_COMMUNITY)
Admission: RE | Admit: 2018-04-30 | Discharge: 2018-04-30 | Disposition: A | Discharge status: Home / Self Care | Payer: Self-pay | Source: Ambulatory Visit | Attending: Emergency Medicine | Admitting: Emergency Medicine

## 2018-04-30 DIAGNOSIS — M79602 Pain in left arm: Secondary | ICD-10-CM | POA: Insufficient documentation

## 2018-04-30 NOTE — ED Provider Notes (Signed)
Pt here for an US of her LUE which was negative.  I told her the results and encouraged her to f/u with pcp.  She is good to go.  Return if worse.     Jacalyn LefevreHaviland, Pansey Pinheiro, MD 04/30/18 1625

## 2018-07-10 DIAGNOSIS — R5383 Other fatigue: Secondary | ICD-10-CM | POA: Diagnosis not present

## 2018-07-10 DIAGNOSIS — E6609 Other obesity due to excess calories: Secondary | ICD-10-CM | POA: Diagnosis not present

## 2018-07-10 DIAGNOSIS — Z1329 Encounter for screening for other suspected endocrine disorder: Secondary | ICD-10-CM | POA: Diagnosis not present

## 2018-07-10 DIAGNOSIS — Z0000 Encounter for general adult medical examination without abnormal findings: Secondary | ICD-10-CM | POA: Diagnosis not present

## 2018-07-18 DIAGNOSIS — Z131 Encounter for screening for diabetes mellitus: Secondary | ICD-10-CM | POA: Diagnosis not present

## 2018-07-18 DIAGNOSIS — R5383 Other fatigue: Secondary | ICD-10-CM | POA: Diagnosis not present

## 2018-07-18 DIAGNOSIS — Z0000 Encounter for general adult medical examination without abnormal findings: Secondary | ICD-10-CM | POA: Diagnosis not present

## 2018-07-18 DIAGNOSIS — E6609 Other obesity due to excess calories: Secondary | ICD-10-CM | POA: Diagnosis not present

## 2018-07-23 DIAGNOSIS — E7800 Pure hypercholesterolemia, unspecified: Secondary | ICD-10-CM | POA: Diagnosis not present

## 2018-07-23 DIAGNOSIS — E6609 Other obesity due to excess calories: Secondary | ICD-10-CM | POA: Diagnosis not present

## 2018-07-23 DIAGNOSIS — Z0001 Encounter for general adult medical examination with abnormal findings: Secondary | ICD-10-CM | POA: Diagnosis not present

## 2018-08-10 DIAGNOSIS — Z6834 Body mass index (BMI) 34.0-34.9, adult: Secondary | ICD-10-CM | POA: Diagnosis not present

## 2018-08-10 DIAGNOSIS — Z1231 Encounter for screening mammogram for malignant neoplasm of breast: Secondary | ICD-10-CM | POA: Diagnosis not present

## 2018-08-10 DIAGNOSIS — Z118 Encounter for screening for other infectious and parasitic diseases: Secondary | ICD-10-CM | POA: Diagnosis not present

## 2018-08-10 DIAGNOSIS — Z113 Encounter for screening for infections with a predominantly sexual mode of transmission: Secondary | ICD-10-CM | POA: Diagnosis not present

## 2018-08-10 DIAGNOSIS — Z1151 Encounter for screening for human papillomavirus (HPV): Secondary | ICD-10-CM | POA: Diagnosis not present

## 2018-08-10 DIAGNOSIS — Z01419 Encounter for gynecological examination (general) (routine) without abnormal findings: Secondary | ICD-10-CM | POA: Diagnosis not present

## 2018-08-24 DIAGNOSIS — Z23 Encounter for immunization: Secondary | ICD-10-CM | POA: Diagnosis not present

## 2019-09-06 ENCOUNTER — Other Ambulatory Visit: Payer: Self-pay

## 2019-09-06 DIAGNOSIS — Z20822 Contact with and (suspected) exposure to covid-19: Secondary | ICD-10-CM

## 2019-09-07 LAB — NOVEL CORONAVIRUS, NAA: SARS-CoV-2, NAA: NOT DETECTED

## 2019-09-24 DIAGNOSIS — Z01419 Encounter for gynecological examination (general) (routine) without abnormal findings: Secondary | ICD-10-CM | POA: Diagnosis not present

## 2019-09-24 DIAGNOSIS — Z6834 Body mass index (BMI) 34.0-34.9, adult: Secondary | ICD-10-CM | POA: Diagnosis not present

## 2019-09-24 DIAGNOSIS — Z118 Encounter for screening for other infectious and parasitic diseases: Secondary | ICD-10-CM | POA: Diagnosis not present

## 2019-09-24 DIAGNOSIS — Z1231 Encounter for screening mammogram for malignant neoplasm of breast: Secondary | ICD-10-CM | POA: Diagnosis not present

## 2019-09-24 DIAGNOSIS — Z1159 Encounter for screening for other viral diseases: Secondary | ICD-10-CM | POA: Diagnosis not present

## 2019-09-24 DIAGNOSIS — Z113 Encounter for screening for infections with a predominantly sexual mode of transmission: Secondary | ICD-10-CM | POA: Diagnosis not present

## 2019-09-24 DIAGNOSIS — Z114 Encounter for screening for human immunodeficiency virus [HIV]: Secondary | ICD-10-CM | POA: Diagnosis not present

## 2019-10-07 ENCOUNTER — Encounter (HOSPITAL_COMMUNITY): Payer: Self-pay

## 2019-10-07 ENCOUNTER — Other Ambulatory Visit: Payer: Self-pay

## 2019-10-07 ENCOUNTER — Emergency Department (HOSPITAL_COMMUNITY): Payer: BC Managed Care – PPO

## 2019-10-07 ENCOUNTER — Emergency Department (HOSPITAL_COMMUNITY)
Admission: EM | Admit: 2019-10-07 | Discharge: 2019-10-07 | Disposition: A | Discharge status: Home / Self Care | Payer: BC Managed Care – PPO | Attending: Emergency Medicine | Admitting: Emergency Medicine

## 2019-10-07 DIAGNOSIS — W16212A Fall in (into) filled bathtub causing other injury, initial encounter: Secondary | ICD-10-CM | POA: Insufficient documentation

## 2019-10-07 DIAGNOSIS — Z87891 Personal history of nicotine dependence: Secondary | ICD-10-CM | POA: Insufficient documentation

## 2019-10-07 DIAGNOSIS — Y9389 Activity, other specified: Secondary | ICD-10-CM | POA: Diagnosis not present

## 2019-10-07 DIAGNOSIS — H0263 Xanthelasma of right eye, unspecified eyelid: Secondary | ICD-10-CM | POA: Diagnosis not present

## 2019-10-07 DIAGNOSIS — Y999 Unspecified external cause status: Secondary | ICD-10-CM | POA: Insufficient documentation

## 2019-10-07 DIAGNOSIS — Y92002 Bathroom of unspecified non-institutional (private) residence single-family (private) house as the place of occurrence of the external cause: Secondary | ICD-10-CM | POA: Diagnosis not present

## 2019-10-07 DIAGNOSIS — S299XXA Unspecified injury of thorax, initial encounter: Secondary | ICD-10-CM | POA: Diagnosis not present

## 2019-10-07 DIAGNOSIS — S2231XA Fracture of one rib, right side, initial encounter for closed fracture: Secondary | ICD-10-CM | POA: Diagnosis not present

## 2019-10-07 DIAGNOSIS — H0266 Xanthelasma of left eye, unspecified eyelid: Secondary | ICD-10-CM | POA: Insufficient documentation

## 2019-10-07 NOTE — Discharge Instructions (Addendum)
Apply ice as needed.   Take acetaminophen, ibuprofen or naproxen as needed for pain.  Your eyelids have cholesterol deposits in them.  Please talk with your primary care provider about checking your blood cholesterol.

## 2019-10-07 NOTE — ED Provider Notes (Signed)
[facility] EMERGENCY DEPARTMENT Provider Note   CSN: [id number] Arrival date & time: 10/07/19  0150    History   Chief Complaint Chief Complaint  Patient presents with  . .Rib Pain    HPI Angela Wise is a 43 y.o. female.   The history is provided by the patient.  Last week, she fell in her tub injuring the right side of her chest.  She was doing reasonably well until yesterday when she started having sharp pains.  He rated the pain at 9/10.  There was mild difficulty breathing.  She denies other injury.  History reviewed. No pertinent past medical history.  There are no active problems to display for this patient.   Past Surgical History:  Procedure Laterality Date  . CESAREAN SECTION       OB History   No obstetric history on file.      Home Medications    Prior to Admission medications   Medication Sig Start Date End Date Taking? Authorizing Provider  ibuprofen (ADVIL,MOTRIN) 200 MG tablet Take 200 mg by mouth every 6 (six) hours as needed for pain.    [provider]  methocarbamol (ROBAXIN) 500 MG tablet Take 1 tablet (500 mg total) by mouth every 8 (eight) hours as needed for muscle spasms. 04/28/18   Rancour, Jeannett Senior, MD  methylPREDNISolone (MEDROL DOSEPAK) 4 MG TBPK tablet As directed 04/28/18   Glynn Octave, MD    Family History History reviewed. No pertinent family history.  Social History Social History   Tobacco Use  . Smoking status: Former Smoker    Types: Cigarettes  . Smokeless tobacco: Never Used  Substance Use Topics  . Alcohol use: Yes    Comment: occasional  . Drug use: Yes    Types: Marijuana, Cocaine    Comment: last used x 1 wk ago     Allergies   Patient has no known allergies.   Review of Systems Review of Systems  All other systems reviewed and are negative.    Physical Exam Updated Vital Signs BP 131/71 (BP Location: Right Arm)   Pulse 69   Temp (!) 97 F (36.1 C) (Temporal)   Resp 16   Ht  5\' 1"  (1.549 m)   Wt 83.5 kg   LMP 10/03/2019 (Exact Date)   SpO2 97%   BMI 34.77 kg/m   Physical Exam Vitals signs and nursing note reviewed.    43 year old female, resting comfortably and in no acute distress. Vital signs are normal. Oxygen saturation is 97%, which is normal. Head is normocephalic and atraumatic. PERRLA, EOMI. Oropharynx is clear.  Bilateral xanthelasmas present. Neck is nontender and supple without adenopathy or JVD. Back is nontender and there is no CVA tenderness. Lungs are clear without rales, wheezes, or rhonchi. Chest has moderate tenderness in the right anterior chest wall without crepitus. Heart has regular rate and rhythm without murmur. Abdomen is soft, flat, nontender without masses or hepatosplenomegaly and peristalsis is normoactive. Extremities have no cyanosis or edema, full range of motion is present. Skin is warm and dry without rash. Neurologic: Mental status is normal, cranial nerves are intact, there are no motor or sensory deficits.  ED Treatments / Results   Radiology Dg Ribs Unilateral W/chest Right  Result Date: 10/07/2019 CLINICAL DATA:  Fall EXAM: RIGHT RIBS AND CHEST - 3+ VIEW COMPARISON:  April 28, 2018 FINDINGS: There is a nondisplaced fracture of the anterior lateral right seventh rib. There is no evidence of pneumothorax  or pleural effusion. Again noted are findings of chronic bronchial thickening seen at both lung bases. Heart size and mediastinal contours are within normal limits. IMPRESSION: Nondisplaced fracture of the anterolateral right seventh rib Electronically Signed   By: Prudencio Pair M.D.   On: 10/07/2019 03:11    Procedures Procedures  Medications Ordered in ED Medications - No data to display   Initial Impression / Assessment and Plan / ED Course  I have reviewed the triage vital signs and the nursing notes.  Pertinent imaging results that were available during my care of the patient were reviewed by me and  considered in my medical decision making (see chart for details).  Fall at home with chest wall injury.  X-ray shows nondisplaced fracture of the right seventh rib.  Patient is advised of this finding told use over-the-counter analgesics as needed for pain.  Incidental finding of bilateral xanthelasma, patient advised to follow-up with PCP for blood lipid panel.  Final Clinical Impressions(s) / ED Diagnoses   Final diagnoses:  Closed fracture of one rib of right side, initial encounter  Xanthelasma of eyelid, bilateral    ED Discharge Orders    None       Delora Fuel, MD 29/51/88 [phone]

## 2019-10-07 NOTE — ED Triage Notes (Signed)
Pt c/o right rib pain from a fall before Thanksgiving. Denies any other injury. Said she hit it on the side of the tub pain isn't going away.

## 2019-10-15 DIAGNOSIS — Z0000 Encounter for general adult medical examination without abnormal findings: Secondary | ICD-10-CM | POA: Diagnosis not present

## 2019-10-15 DIAGNOSIS — Z13220 Encounter for screening for lipoid disorders: Secondary | ICD-10-CM | POA: Diagnosis not present

## 2019-10-15 DIAGNOSIS — R768 Other specified abnormal immunological findings in serum: Secondary | ICD-10-CM | POA: Diagnosis not present

## 2019-10-28 DIAGNOSIS — Z0000 Encounter for general adult medical examination without abnormal findings: Secondary | ICD-10-CM | POA: Diagnosis not present

## 2019-10-28 DIAGNOSIS — Z13220 Encounter for screening for lipoid disorders: Secondary | ICD-10-CM | POA: Diagnosis not present

## 2019-10-28 DIAGNOSIS — R739 Hyperglycemia, unspecified: Secondary | ICD-10-CM | POA: Diagnosis not present

## 2019-12-30 DIAGNOSIS — N920 Excessive and frequent menstruation with regular cycle: Secondary | ICD-10-CM | POA: Diagnosis not present

## 2020-01-19 DIAGNOSIS — N920 Excessive and frequent menstruation with regular cycle: Secondary | ICD-10-CM | POA: Diagnosis not present

## 2020-05-25 DIAGNOSIS — Z23 Encounter for immunization: Secondary | ICD-10-CM | POA: Diagnosis not present

## 2020-06-16 DIAGNOSIS — Z23 Encounter for immunization: Secondary | ICD-10-CM | POA: Diagnosis not present

## 2020-10-19 DIAGNOSIS — E782 Mixed hyperlipidemia: Secondary | ICD-10-CM | POA: Diagnosis not present

## 2020-10-19 DIAGNOSIS — Z23 Encounter for immunization: Secondary | ICD-10-CM | POA: Diagnosis not present

## 2020-10-19 DIAGNOSIS — Z1159 Encounter for screening for other viral diseases: Secondary | ICD-10-CM | POA: Diagnosis not present

## 2020-10-19 DIAGNOSIS — Z0000 Encounter for general adult medical examination without abnormal findings: Secondary | ICD-10-CM | POA: Diagnosis not present

## 2020-10-19 DIAGNOSIS — R7301 Impaired fasting glucose: Secondary | ICD-10-CM | POA: Diagnosis not present

## 2020-11-01 DIAGNOSIS — Z1231 Encounter for screening mammogram for malignant neoplasm of breast: Secondary | ICD-10-CM | POA: Diagnosis not present

## 2020-11-01 DIAGNOSIS — Z01419 Encounter for gynecological examination (general) (routine) without abnormal findings: Secondary | ICD-10-CM | POA: Diagnosis not present

## 2020-11-01 DIAGNOSIS — Z6836 Body mass index (BMI) 36.0-36.9, adult: Secondary | ICD-10-CM | POA: Diagnosis not present

## 2021-11-19 ENCOUNTER — Encounter: Payer: Self-pay | Admitting: Internal Medicine

## 2022-01-09 ENCOUNTER — Ambulatory Visit (INDEPENDENT_AMBULATORY_CARE_PROVIDER_SITE_OTHER): Payer: Self-pay | Admitting: *Deleted

## 2022-01-09 ENCOUNTER — Other Ambulatory Visit: Payer: Self-pay

## 2022-01-09 VITALS — Ht 61.5 in | Wt 193.8 lb

## 2022-01-09 DIAGNOSIS — Z1211 Encounter for screening for malignant neoplasm of colon: Secondary | ICD-10-CM

## 2022-01-09 NOTE — Progress Notes (Addendum)
Gastroenterology Pre-Procedure Review  Request Date: 01/09/2022 Requesting Physician: Aliene Beams, no previous TCS  PATIENT REVIEW QUESTIONS: The patient responded to the following health history questions as indicated:    1. Diabetes Melitis: no 2. Joint replacements in the past 12 months: no 3. Major health problems in the past 3 months: no 4. Has an artificial valve or MVP: no 5. Has a defibrillator: no 6. Has been advised in past to take antibiotics in advance of a procedure like teeth cleaning: no 7. Family history of colon cancer: no, unsure due to being adopted  8. Alcohol Use: yes, 1 drink a week 9. Illicit drug Use: yes, marijuana daily 10. History of sleep apnea: no  11. History of coronary artery or other vascular stents placed within the last 12 months: no 12. History of any prior anesthesia complications: no 13. Body mass index is 36.03 kg/m.    MEDICATIONS & ALLERGIES:    Patient reports the following regarding taking any blood thinners:   Plavix? no Aspirin? no Coumadin? no Brilinta? no Xarelto? no Eliquis? no Pradaxa? no Savaysa? no Effient? no  Patient confirms/reports the following medications:  Current Outpatient Medications  Medication Sig Dispense Refill   ibuprofen (ADVIL,MOTRIN) 200 MG tablet Take 200 mg by mouth as needed for pain.     rosuvastatin (CRESTOR) 20 MG tablet Take 20 mg by mouth daily at 6 (six) AM.     No current facility-administered medications for this visit.    Patient confirms/reports the following allergies:  No Known Allergies  No orders of the defined types were placed in this encounter.   AUTHORIZATION INFORMATION Primary Insurance: [facility],  ID #: [id number],  Group #: [id number] Pre-Cert / Berkley Harvey required:  Pre-Cert / Auth #:   SCHEDULE INFORMATION: Procedure has been scheduled as follows:  Date: 02/01/2022, Time: 1:45 Location: [facility] with Dr. Marletta Lor  This Gastroenterology Pre-Precedure Review Form is being  routed to the following provider(s): Tana Coast, PA-C

## 2022-01-10 NOTE — Progress Notes (Addendum)
Ok to schedule, propofol. ASA 2. ?

## 2022-01-11 ENCOUNTER — Other Ambulatory Visit: Payer: Self-pay | Admitting: *Deleted

## 2022-01-11 ENCOUNTER — Encounter: Payer: Self-pay | Admitting: *Deleted

## 2022-01-11 DIAGNOSIS — Z1211 Encounter for screening for malignant neoplasm of colon: Secondary | ICD-10-CM

## 2022-01-11 MED ORDER — PEG 3350-KCL-NA BICARB-NACL 420 G PO SOLR: 4000.0000 mL | Freq: Once | ORAL | 0 refills | Status: AC

## 2022-01-11 NOTE — Addendum Note (Signed)
Addended by: Metro Kung on: 01/11/2022 08:56 AM ? ? Modules accepted: Orders ? ?

## 2022-01-11 NOTE — Progress Notes (Signed)
Spoke to pt.  Scheduled procedure for 02/01/2022 at 1:45, arrival at 12:15 at [facility].  Reviewed prep instructions with pt by phone.  Pt made aware that she needs to have pregnancy test on 01/30/2022 at [facility].  Pt made aware that I will be mailing out prep instructions.  Confirmed mailing address. ?

## 2022-01-17 ENCOUNTER — Other Ambulatory Visit: Payer: Self-pay | Admitting: *Deleted

## 2022-01-17 ENCOUNTER — Telehealth: Payer: Self-pay | Admitting: *Deleted

## 2022-01-17 NOTE — Progress Notes (Signed)
Spoke to [employer] at [employer].  No age criteria for colonoscopies.  REF: Felicity Coyer 01/17/2022 ?

## 2022-01-17 NOTE — Telephone Encounter (Signed)
Spoke to pt.  Informed her that we needed to move her procedure time up to 10:15, arrival at 8:45 at [facility].  Pt informed to change her drinking time on the date of procedure to 5:15 am and NPO after 7:15 am.  She voiced understanding and changed accordingly.  Called and left voice mail for [place] in [facility]. ?

## 2022-01-28 NOTE — Patient Instructions (Signed)
? ? ? ? ? ? Angela Wise ? 01/28/2022  ?  ? @PREFPERIOPPHARMACY @ ? ? Your procedure is scheduled on  02/01/2022. ? ? Report to 02/03/2022 at  0745 A.M. ? ? Call this number if you have problems the morning of surgery: ? [phone] ? ? Remember: ? Follow the diet and prep instructions given to you by the office. ?  ? Take these medicines the morning of surgery with A SIP OF WATER  ? ?None ?  ? Do not wear jewelry, make-up or nail polish. ? Do not wear lotions, powders, or perfumes, or deodorant. ? Do not shave 48 hours prior to surgery.  Men may shave face and neck. ? Do not bring valuables to the hospital. ? [facility] is not responsible for any belongings or valuables. ? ?Contacts, dentures or bridgework may not be worn into surgery.  Leave your suitcase in the car.  After surgery it may be brought to your room. ? ?For patients admitted to the hospital, discharge time will be determined by your treatment team. ? ?Patients discharged the day of surgery will not be allowed to drive home and must have someone with them for 24 hours.  ? ? ?Special instructions:   DO NOT smoke tobacco or vape for 24 hours before your procedure. ? ?Please read over the following fact sheets that you were given. ?Anesthesia Post-op Instructions and Care and Recovery After Surgery ?  ? ? ? Colonoscopy, Adult, Care After ?This sheet gives you information about how to care for yourself after your procedure. Your health care provider may also give you more specific instructions. If you have problems or questions, contact your health care provider. ?What can I expect after the procedure? ?After the procedure, it is common to have: ?A small amount of blood in your stool for 24 hours after the procedure. ?Some gas. ?Mild cramping or bloating of your abdomen. ?Follow these instructions at home: ?Eating and drinking ? ?Drink enough fluid to keep your urine pale yellow. ?Follow instructions from your health care provider about eating or  drinking restrictions. ?Resume your normal diet as instructed by your health care provider. Avoid heavy or fried foods that are hard to digest. ?Activity ?Rest as told by your health care provider. ?Avoid sitting for a long time without moving. Get up to take short walks every 1-2 hours. This is important to improve blood flow and breathing. Ask for help if you feel weak or unsteady. ?Return to your normal activities as told by your health care provider. Ask your health care provider what activities are safe for you. ?Managing cramping and bloating ? ?Try walking around when you have cramps or feel bloated. ?Apply heat to your abdomen as told by your health care provider. Use the heat source that your health care provider recommends, such as a moist heat pack or a heating pad. ?Place a towel between your skin and the heat source. ?Leave the heat on for 20-30 minutes. ?Remove the heat if your skin turns bright red. This is especially important if you are unable to feel pain, heat, or cold. You may have a greater risk of getting burned. ?General instructions ?If you were given a sedative during the procedure, it can affect you for several hours. Do not drive or operate machinery until your health care provider says that it is safe. ?For the first 24 hours after the procedure: ?Do not sign important documents. ?Do not drink alcohol. ?Do your regular daily  activities at a slower pace than normal. ?Eat soft foods that are easy to digest. ?Take over-the-counter and prescription medicines only as told by your health care provider. ?Keep all follow-up visits as told by your health care provider. This is important. ?Contact a health care provider if: ?You have blood in your stool 2-3 days after the procedure. ?Get help right away if you have: ?More than a small spotting of blood in your stool. ?Large blood clots in your stool. ?Swelling of your abdomen. ?Nausea or vomiting. ?A fever. ?Increasing pain in your abdomen that is  not relieved with medicine. ?Summary ?After the procedure, it is common to have a small amount of blood in your stool. You may also have mild cramping and bloating of your abdomen. ?If you were given a sedative during the procedure, it can affect you for several hours. Do not drive or operate machinery until your health care provider says that it is safe. ?Get help right away if you have a lot of blood in your stool, nausea or vomiting, a fever, or increased pain in your abdomen. ?This information is not intended to replace advice given to you by your health care provider. Make sure you discuss any questions you have with your health care provider. ?Document Revised: 08/27/2019 Document Reviewed: 05/17/2019 ?Elsevier Patient Education ? [place]. ?Monitored Anesthesia Care, Care After ?This sheet gives you information about how to care for yourself after your procedure. Your health care provider may also give you more specific instructions. If you have problems or questions, contact your health care provider. ?What can I expect after the procedure? ?After the procedure, it is common to have: ?Tiredness. ?Forgetfulness about what happened after the procedure. ?Impaired judgment for important decisions. ?Nausea or vomiting. ?Some difficulty with balance. ?Follow these instructions at home: ?For the time period you were told by your health care provider: ?  ?Rest as needed. ?Do not participate in activities where you could fall or become injured. ?Do not drive or use machinery. ?Do not drink alcohol. ?Do not take sleeping pills or medicines that cause drowsiness. ?Do not make important decisions or sign legal documents. ?Do not take care of children on your own. ?Eating and drinking ?Follow the diet that is recommended by your health care provider. ?Drink enough fluid to keep your urine pale yellow. ?If you vomit: ?Drink water, juice, or soup when you can drink without vomiting. ?Make sure you have little or  no nausea before eating solid foods. ?General instructions ?Have a responsible adult stay with you for the time you are told. It is important to have someone help care for you until you are awake and alert. ?Take over-the-counter and prescription medicines only as told by your health care provider. ?If you have sleep apnea, surgery and certain medicines can increase your risk for breathing problems. Follow instructions from your health care provider about wearing your sleep device: ?Anytime you are sleeping, including during daytime naps. ?While taking prescription pain medicines, sleeping medicines, or medicines that make you drowsy. ?Avoid smoking. ?Keep all follow-up visits as told by your health care provider. This is important. ?Contact a health care provider if: ?You keep feeling nauseous or you keep vomiting. ?You feel light-headed. ?You are still sleepy or having trouble with balance after 24 hours. ?You develop a rash. ?You have a fever. ?You have redness or swelling around the IV site. ?Get help right away if: ?You have trouble breathing. ?You have new-onset confusion at home. ?Summary ?For  several hours after your procedure, you may feel tired. You may also be forgetful and have poor judgment. ?Have a responsible adult stay with you for the time you are told. It is important to have someone help care for you until you are awake and alert. ?Rest as told. Do not drive or operate machinery. Do not drink alcohol or take sleeping pills. ?Get help right away if you have trouble breathing, or if you suddenly become confused. ?This information is not intended to replace advice given to you by your health care provider. Make sure you discuss any questions you have with your health care provider. ?Document Revised: 07/06/2020 Document Reviewed: 09/23/2019 ?Elsevier Patient Education ? [place]. ? ?

## 2022-01-29 ENCOUNTER — Encounter (HOSPITAL_COMMUNITY): Payer: Self-pay

## 2022-01-29 ENCOUNTER — Encounter (HOSPITAL_COMMUNITY)
Admission: RE | Admit: 2022-01-29 | Discharge: 2022-01-29 | Disposition: A | Discharge status: Home / Self Care | Payer: Medicaid Other | Source: Ambulatory Visit | Attending: Internal Medicine | Admitting: Internal Medicine

## 2022-01-30 ENCOUNTER — Other Ambulatory Visit (HOSPITAL_COMMUNITY)
Admission: RE | Admit: 2022-01-30 | Discharge: 2022-01-30 | Disposition: A | Discharge status: Home / Self Care | Payer: No Typology Code available for payment source | Source: Ambulatory Visit | Attending: Internal Medicine | Admitting: Internal Medicine

## 2022-01-30 DIAGNOSIS — Z1211 Encounter for screening for malignant neoplasm of colon: Secondary | ICD-10-CM | POA: Insufficient documentation

## 2022-01-30 LAB — PREGNANCY, URINE: Preg Test, Ur: NEGATIVE

## 2022-02-01 ENCOUNTER — Ambulatory Visit (HOSPITAL_COMMUNITY): Payer: No Typology Code available for payment source | Admitting: Certified Registered Nurse Anesthetist

## 2022-02-01 ENCOUNTER — Ambulatory Visit (HOSPITAL_BASED_OUTPATIENT_CLINIC_OR_DEPARTMENT_OTHER): Payer: No Typology Code available for payment source | Admitting: Certified Registered Nurse Anesthetist

## 2022-02-01 ENCOUNTER — Encounter (HOSPITAL_COMMUNITY): Payer: Self-pay

## 2022-02-01 ENCOUNTER — Encounter (HOSPITAL_COMMUNITY)
Admission: RE | Disposition: A | Discharge status: Home / Self Care | Payer: Self-pay | Source: Home / Self Care | Attending: Internal Medicine

## 2022-02-01 ENCOUNTER — Ambulatory Visit (HOSPITAL_COMMUNITY)
Admission: RE | Admit: 2022-02-01 | Discharge: 2022-02-01 | Disposition: A | Discharge status: Home / Self Care | Payer: No Typology Code available for payment source | Attending: Internal Medicine | Admitting: Internal Medicine

## 2022-02-01 DIAGNOSIS — K648 Other hemorrhoids: Secondary | ICD-10-CM

## 2022-02-01 DIAGNOSIS — Z87891 Personal history of nicotine dependence: Secondary | ICD-10-CM | POA: Diagnosis not present

## 2022-02-01 DIAGNOSIS — Z1211 Encounter for screening for malignant neoplasm of colon: Secondary | ICD-10-CM | POA: Insufficient documentation

## 2022-02-01 HISTORY — PX: COLONOSCOPY WITH PROPOFOL: SHX5780

## 2022-02-01 SURGERY — COLONOSCOPY WITH PROPOFOL
Anesthesia: General

## 2022-02-01 MED ORDER — PROPOFOL 10 MG/ML IV BOLUS
INTRAVENOUS | Status: DC | PRN
  Administered 2022-02-01: 80 mg via INTRAVENOUS

## 2022-02-01 MED ORDER — PROPOFOL 500 MG/50ML IV EMUL
INTRAVENOUS | Status: DC | PRN
  Administered 2022-02-01: 150 ug/kg/min via INTRAVENOUS

## 2022-02-01 MED ORDER — LACTATED RINGERS IV SOLN
INTRAVENOUS | Status: DC
  Administered 2022-02-01: 1000 mL via INTRAVENOUS

## 2022-02-01 MED ORDER — LACTATED RINGERS IV SOLN: INTRAVENOUS | Status: DC | PRN

## 2022-02-01 MED ORDER — LIDOCAINE HCL (CARDIAC) PF 100 MG/5ML IV SOSY
PREFILLED_SYRINGE | INTRAVENOUS | Status: DC | PRN
  Administered 2022-02-01: 60 mg via INTRATRACHEAL

## 2022-02-01 NOTE — Anesthesia Preprocedure Evaluation (Signed)
Anesthesia Evaluation  ?Patient identified by MRN, date of birth, ID band ?Patient awake ? ? ? ?Reviewed: ?Allergy & Precautions, H&P , NPO status , Patient's Chart, lab work & pertinent test results, reviewed documented beta blocker date and time  ? ?Airway ?Mallampati: II ? ?TM Distance: >3 FB ?Neck ROM: full ? ? ? Dental ?no notable dental hx. ? ?  ?Pulmonary ?neg pulmonary ROS, Patient abstained from smoking., former smoker,  ?  ?Pulmonary exam normal ?breath sounds clear to auscultation ? ? ? ? ? ? Cardiovascular ?Exercise Tolerance: Good ?negative cardio ROS ? ? ?Rhythm:regular Rate:Normal ? ? ?  ?Neuro/Psych ?negative neurological ROS ? negative psych ROS  ? GI/Hepatic ?negative GI ROS, Neg liver ROS,   ?Endo/Other  ?negative endocrine ROS ? Renal/GU ?negative Renal ROS  ?negative genitourinary ?  ?Musculoskeletal ? ? Abdominal ?  ?Peds ? Hematology ?negative hematology ROS ?(+)   ?Anesthesia Other Findings ? ? Reproductive/Obstetrics ?negative OB ROS ? ?  ? ? ? ? ? ? ? ? ? ? ? ? ? ?  ?  ? ? ? ? ? ? ? ? ?Anesthesia Physical ?Anesthesia Plan ? ?ASA: 1 ? ?Anesthesia Plan: General  ? ?Post-op Pain Management:   ? ?Induction:  ? ?PONV Risk Score and Plan: Propofol infusion ? ?Airway Management Planned:  ? ?Additional Equipment:  ? ?Intra-op Plan:  ? ?Post-operative Plan:  ? ?Informed Consent: I have reviewed the patients History and Physical, chart, labs and discussed the procedure including the risks, benefits and alternatives for the proposed anesthesia with the patient or authorized representative who has indicated his/her understanding and acceptance.  ? ? ? ?Dental Advisory Given ? ?Plan Discussed with: CRNA ? ?Anesthesia Plan Comments:   ? ? ? ? ? ? ?Anesthesia Quick Evaluation ? ?

## 2022-02-01 NOTE — Discharge Instructions (Addendum)

## 2022-02-01 NOTE — Anesthesia Postprocedure Evaluation (Signed)
Anesthesia Post Note ? ?Patient: Angela Wise ? ?Procedure(s) Performed: COLONOSCOPY WITH PROPOFOL ? ?Patient location during evaluation: Phase II ?Anesthesia Type: General ?Level of consciousness: awake ?Pain management: pain level controlled ?Vital Signs Assessment: post-procedure vital signs reviewed and stable ?Respiratory status: spontaneous breathing and respiratory function stable ?Cardiovascular status: blood pressure returned to baseline and stable ?Postop Assessment: no headache and no apparent nausea or vomiting ?Anesthetic complications: no ?Comments: Late entry ? ? ?No notable events documented. ? ? ?Last Vitals:  ?Vitals:  ? 02/01/22 0936 02/01/22 1009  ?BP: 131/71 121/64  ?Resp: 18 20  ?Temp: 36.6 ?C 36.6 ?C  ?SpO2: 98% 97%  ?  ?Last Pain:  ?Vitals:  ? 02/01/22 1009  ?TempSrc:   ?PainSc: 0-No pain  ? ? ?  ?  ?  ?  ?  ?  ? ?Louann Sjogren ? ? ? ? ?

## 2022-02-01 NOTE — H&P (Signed)
Primary Care Physician:  Patient, No Pcp Per (Inactive) ?Primary Gastroenterologist:  Dr. Marletta Lor ? ?Pre-Procedure History & Physical: ?HPI:  [name] is a 46 y.o. female is here for first ever colonoscopy for colon cancer screening purposes.  Patient denies any family history of colorectal cancer.  No melena or hematochezia.  No abdominal pain or unintentional weight loss.  No change in bowel habits.  Overall feels well from a GI standpoint. ? ?History reviewed. No pertinent past medical history. ? ?Past Surgical History:  ?Procedure Laterality Date  ? CESAREAN SECTION    ? ? ?Prior to Admission medications   ?Medication Sig Start Date End Date Taking? Authorizing Provider  ?acetaminophen (TYLENOL) 500 MG tablet Take 500-1,000 mg by mouth every 6 (six) hours as needed for moderate pain.   Yes [provider]  ?ibuprofen (ADVIL,MOTRIN) 200 MG tablet Take 400 mg by mouth every 6 (six) hours as needed for moderate pain.   Yes [provider]  ?rosuvastatin (CRESTOR) 20 MG tablet Take 20 mg by mouth daily. 01/06/22  Yes [provider]  ? ? ?Allergies as of 01/11/2022  ? (No Known Allergies)  ? ? ?History reviewed. No pertinent family history. ? ?Social History  ? ?Socioeconomic History  ? Marital status: Single  ?  Spouse name: Not on file  ? Number of children: Not on file  ? Years of education: Not on file  ? Highest education level: Not on file  ?Occupational History  ? Not on file  ?Tobacco Use  ? Smoking status: Former  ?  Types: Cigarettes  ? Smokeless tobacco: Never  ?Substance and Sexual Activity  ? Alcohol use: Yes  ?  Comment: occasional  ? Drug use: Yes  ?  Types: Marijuana, Cocaine  ?  Comment: last used x 1 wk ago  ? Sexual activity: Not on file  ?Other Topics Concern  ? Not on file  ?Social History Narrative  ? Not on file  ? ?Social Determinants of Health  ? ?Financial Resource Strain: Not on file  ?Food Insecurity: Not on file  ?Transportation Needs: Not on file   ?Physical Activity: Not on file  ?Stress: Not on file  ?Social Connections: Not on file  ?Intimate Partner Violence: Not on file  ? ? ?Review of Systems: ?See HPI, otherwise negative ROS ? ?Physical Exam: ?Vital signs in last 24 hours: ?Temp:  [97.8 ?F (36.6 ?C)] 97.8 ?F (36.6 ?C) (03/31 1638) ?Resp:  [18] 18 (03/31 0936) ?BP: (131)/(71) 131/71 (03/31 0936) ?SpO2:  [98 %] 98 % (03/31 0936) ?Weight:  [85.7 kg] 85.7 kg (03/31 0936) ?  ?General:   Alert,  Well-developed, well-nourished, pleasant and cooperative in NAD ?Head:  Normocephalic and atraumatic. ?Eyes:  Sclera clear, no icterus.   Conjunctiva pink. ?Ears:  Normal auditory acuity. ?Nose:  No deformity, discharge,  or lesions. ?Mouth:  No deformity or lesions, dentition normal. ?Neck:  Supple; no masses or thyromegaly. ?Lungs:  Clear throughout to auscultation.   No wheezes, crackles, or rhonchi. No acute distress. ?Heart:  Regular rate and rhythm; no murmurs, clicks, rubs,  or gallops. ?Abdomen:  Soft, nontender and nondistended. No masses, hepatosplenomegaly or hernias noted. Normal bowel sounds, without guarding, and without rebound.   ?Msk:  Symmetrical without gross deformities. Normal posture. ?Extremities:  Without clubbing or edema. ?Neurologic:  Alert and  oriented x4;  grossly normal neurologically. ?Skin:  Intact without significant lesions or rashes. ?Cervical Nodes:  No significant cervical adenopathy. ?Psych:  Alert and  cooperative. Normal mood and affect. ? ?Impression/Plan: ?Angela Wise is here for a colonoscopy to be performed for colon cancer screening purposes. ? ?The risks of the procedure including infection, bleed, or perforation as well as benefits, limitations, alternatives and imponderables have been reviewed with the patient. Questions have been answered. All parties agreeable. ? ?

## 2022-02-01 NOTE — Transfer of Care (Signed)
Immediate Anesthesia Transfer of Care Note ? ?Patient: Angela Wise ? ?Procedure(s) Performed: COLONOSCOPY WITH PROPOFOL ? ?Patient Location: PACU ? ?Anesthesia Type:General ? ?Level of Consciousness: awake and alert  ? ?Airway & Oxygen Therapy: Patient Spontanous Breathing ? ?Post-op Assessment: Report given to RN and Post -op Vital signs reviewed and stable ? ?Post vital signs: Reviewed and stable ? ?Last Vitals:  ?Vitals Value Taken Time  ?BP    ?Temp    ?Pulse    ?Resp    ?SpO2    ? ? ?Last Pain:  ?Vitals:  ? 02/01/22 0949  ?TempSrc:   ?PainSc: 0-No pain  ?   ? ?Patients Stated Pain Goal: 7 (02/01/22 0936) ? ?Complications: No notable events documented. ?

## 2022-02-01 NOTE — Op Note (Signed)
[facility] ?Patient Name: Angela Wise ?Procedure Date: 02/01/2022 9:41 AM ?MRN: [id number] ?Date of Birth: 10-11-1976 ?Attending MD: Elon Alas. Abbey Chatters , DO ?CSN: [id number] ?Age: 46 ?Admit Type: Outpatient ?Procedure:                Colonoscopy ?Indications:              Screening for colorectal malignant neoplasm ?Providers:                Elon Alas. Abbey Chatters, DO, Charlsie Quest. Theda Sers RN, Therapist, sports,  ?                          Randa Spike, Technician ?Referring MD:              ?Medicines:                See the Anesthesia note for documentation of the  ?                          administered medications ?Complications:            No immediate complications. ?Estimated Blood Loss:     Estimated blood loss: none. ?Procedure:                Pre-Anesthesia Assessment: ?                          - The anesthesia plan was to use monitored  ?                          anesthesia care (MAC). ?                          After obtaining informed consent, the colonoscope  ?                          was passed under direct vision. Throughout the  ?                          procedure, the patient's blood pressure, pulse, and  ?                          oxygen saturations were monitored continuously. The  ?                          [id number]-HQ190L ([id number]) scope was introduced through  ?                          the anus and advanced to the the cecum, identified  ?                          by appendiceal orifice and ileocecal valve. The  ?                          colonoscopy was performed without difficulty. The  ?                          patient tolerated the procedure well.  The quality  ?                          of the bowel preparation was evaluated using the  ?                          BBPS [facility] Bowel Preparation Scale) with scores  ?                          of: Right Colon = 3, Transverse Colon = 3 and Left  ?                          Colon = 3 (entire mucosa seen well with no residual  ?                           staining, small fragments of stool or opaque  ?                          liquid). The total BBPS score equals 9. ?Scope In: 9:54:10 AM ?Scope Out: 10:04:35 AM ?Scope Withdrawal Time: 0 hours 7 minutes 28 seconds  ?Total Procedure Duration: 0 hours 10 minutes 25 seconds  ?Findings: ?     The perianal and digital rectal examinations were normal. ?     Non-bleeding internal hemorrhoids were found during endoscopy. ?     The exam was otherwise without abnormality. ?Impression:               - Non-bleeding internal hemorrhoids. ?                          - The examination was otherwise normal. ?                          - No specimens collected. ?Moderate Sedation: ?     Per Anesthesia Care ?Recommendation:           - Patient has a contact number available for  ?                          emergencies. The signs and symptoms of potential  ?                          delayed complications were discussed with the  ?                          patient. Return to normal activities tomorrow.  ?                          Written discharge instructions were provided to the  ?                          patient. ?                          - Resume previous diet. ?                          -  Continue present medications. ?                          - Repeat colonoscopy in 10 years for screening  ?                          purposes. ?                          - Return to GI clinic PRN. ?Procedure Code(s):        --- Professional --- ?                          X6553, Colorectal cancer screening; colonoscopy on  ?                          individual not meeting criteria for high risk ?Diagnosis Code(s):        --- Professional --- ?                          Z12.11, Encounter for screening for malignant  ?                          neoplasm of colon ?                          K64.8, Other hemorrhoids ?CPT copyright 2019 [employer] Medical [employer]. All rights reserved. ?The codes documented in this report are preliminary and upon coder  review may  ?be revised to meet current compliance requirements. ?Elon Alas. Abbey Chatters, DO ?Elon Alas. [place], DO ?02/01/2022 10:06:27 AM ?This report has been signed electronically. ?Number of Addenda: 0 ?

## 2022-02-05 ENCOUNTER — Encounter (HOSPITAL_COMMUNITY): Payer: Self-pay | Admitting: Internal Medicine
